# Patient Record
Sex: Female | Born: 1935 | Race: Black or African American | Hispanic: No | Marital: Married | Smoking: Never smoker
Health system: Southern US, Community
[De-identification: ages and names within clinical notes are randomized; demographics above are authoritative.]

## PROBLEM LIST (undated history)

## (undated) DIAGNOSIS — I10 Essential (primary) hypertension: Secondary | ICD-10-CM

## (undated) HISTORY — DX: Essential (primary) hypertension: I10

---

## 2003-11-27 ENCOUNTER — Ambulatory Visit (HOSPITAL_COMMUNITY): Admission: RE | Admit: 2003-11-27 | Discharge: 2003-11-27 | Payer: Self-pay | Admitting: Internal Medicine

## 2004-02-16 ENCOUNTER — Emergency Department (HOSPITAL_COMMUNITY): Admission: EM | Admit: 2004-02-16 | Discharge: 2004-02-16 | Payer: Self-pay

## 2006-03-14 ENCOUNTER — Other Ambulatory Visit: Admission: RE | Admit: 2006-03-14 | Discharge: 2006-03-14 | Payer: Self-pay | Admitting: Internal Medicine

## 2006-03-28 ENCOUNTER — Encounter: Admission: RE | Admit: 2006-03-28 | Discharge: 2006-03-28 | Payer: Self-pay | Admitting: Internal Medicine

## 2009-10-06 ENCOUNTER — Ambulatory Visit (HOSPITAL_COMMUNITY): Admission: RE | Admit: 2009-10-06 | Discharge: 2009-10-06 | Payer: Self-pay | Admitting: Internal Medicine

## 2009-10-12 ENCOUNTER — Emergency Department (HOSPITAL_COMMUNITY): Admission: EM | Admit: 2009-10-12 | Discharge: 2009-10-13 | Payer: Self-pay | Admitting: Emergency Medicine

## 2009-10-15 ENCOUNTER — Ambulatory Visit (HOSPITAL_COMMUNITY): Admission: RE | Admit: 2009-10-15 | Discharge: 2009-10-15 | Payer: Self-pay | Admitting: Internal Medicine

## 2009-10-15 ENCOUNTER — Encounter: Payer: Self-pay | Admitting: Internal Medicine

## 2010-10-17 LAB — COMPREHENSIVE METABOLIC PANEL
ALT: 14 U/L (ref 0–35)
AST: 24 U/L (ref 0–37)
Albumin: 3.5 g/dL (ref 3.5–5.2)
Alkaline Phosphatase: 67 U/L (ref 39–117)
Creatinine, Ser: 1.41 mg/dL — ABNORMAL HIGH (ref 0.4–1.2)
Potassium: 2.8 mEq/L — ABNORMAL LOW (ref 3.5–5.1)
Total Bilirubin: 0.6 mg/dL (ref 0.3–1.2)
Total Protein: 7.1 g/dL (ref 6.0–8.3)

## 2010-10-17 LAB — DIFFERENTIAL
Basophils Absolute: 0 10*3/uL (ref 0.0–0.1)
Basophils Relative: 0 % (ref 0–1)
Eosinophils Absolute: 0.1 10*3/uL (ref 0.0–0.7)
Eosinophils Relative: 1 % (ref 0–5)
Lymphocytes Relative: 8 % — ABNORMAL LOW (ref 12–46)
Lymphs Abs: 0.9 10*3/uL (ref 0.7–4.0)
Monocytes Relative: 1 % — ABNORMAL LOW (ref 3–12)
Neutro Abs: 11.4 10*3/uL — ABNORMAL HIGH (ref 1.7–7.7)

## 2010-10-17 LAB — CBC
MCV: 91.4 fL (ref 78.0–100.0)
Platelets: 169 10*3/uL (ref 150–400)
RDW: 14.1 % (ref 11.5–15.5)

## 2010-10-17 LAB — LIPASE, BLOOD: Lipase: 23 U/L (ref 11–59)

## 2011-01-04 ENCOUNTER — Ambulatory Visit (HOSPITAL_COMMUNITY)
Admission: RE | Admit: 2011-01-04 | Discharge: 2011-01-04 | Disposition: A | Discharge status: Home / Self Care | Payer: BC Managed Care – PPO | Source: Ambulatory Visit | Attending: Internal Medicine | Admitting: Internal Medicine

## 2011-01-04 ENCOUNTER — Other Ambulatory Visit: Payer: Self-pay | Admitting: Internal Medicine

## 2011-01-04 DIAGNOSIS — R05 Cough: Secondary | ICD-10-CM

## 2011-01-04 DIAGNOSIS — R059 Cough, unspecified: Secondary | ICD-10-CM | POA: Insufficient documentation

## 2011-01-04 DIAGNOSIS — I517 Cardiomegaly: Secondary | ICD-10-CM | POA: Insufficient documentation

## 2011-01-18 ENCOUNTER — Telehealth: Payer: Self-pay | Admitting: *Deleted

## 2011-01-18 NOTE — Telephone Encounter (Signed)
Spoke with someone from Dr Ophelia Charter office and sched appt with MW for 8:45 on 01/20/11. Advised her to have the pt arrive 15 min early and bring all meds in hand. She verbalized understanding and states will inform the pt.

## 2011-01-20 ENCOUNTER — Ambulatory Visit (INDEPENDENT_AMBULATORY_CARE_PROVIDER_SITE_OTHER): Payer: Medicare Other | Admitting: Internal Medicine

## 2011-01-20 ENCOUNTER — Encounter: Payer: Self-pay | Admitting: Internal Medicine

## 2011-01-20 VITALS — BP 120/70 | HR 73 | Temp 97.9°F | Ht 63.0 in | Wt 145.8 lb

## 2011-01-20 DIAGNOSIS — R05 Cough: Secondary | ICD-10-CM

## 2011-01-20 DIAGNOSIS — R053 Chronic cough: Secondary | ICD-10-CM | POA: Insufficient documentation

## 2011-01-20 MED ORDER — PREDNISONE (PAK) 10 MG PO TABS: ORAL_TABLET | ORAL | Status: AC

## 2011-01-20 NOTE — Progress Notes (Signed)
Subjective:     Patient ID: Yvonne Riddle, female   DOB: 26-Sep-1930, 77 y.o.   MRN: [id number]  HPI  63 yobf never smoker no prev respiratory/allergy  problems referred for chronic cough by Dr Chestine Spore 12/2010   01/20/2011 Initial pulmonary office indolent onset x 3 months first when lie down and evolved to 24 hours and brings up a little white mucus assoc with some sneezing.  Has not tried otc's but tried off lasartan and started lansoprazole  Taking it before bfast daily but only 15 mg, maybe helps some.  Constant sensation of too much throat mucus. No assoc sob  Pt denies any significant sore throat, dysphagia, itching, sneezing,  nasal congestion or excess/ purulent secretions,  fever, chills, sweats, unintended wt loss, pleuritic or exertional cp, hempoptysis, orthopnea pnd or leg swelling.    Also denies any obvious fluctuation of symptoms with weather or environmental changes or other aggravating or alleviating factors.     Review of Systems  Constitutional: Negative for fever, chills and unexpected weight change.  HENT: Negative for ear pain, nosebleeds, congestion, sore throat, rhinorrhea, sneezing, trouble swallowing, dental problem, voice change, postnasal drip and sinus pressure.   Eyes: Negative for visual disturbance.  Respiratory: Positive for cough. Negative for choking and shortness of breath.   Cardiovascular: Negative for chest pain and leg swelling.  Gastrointestinal: Negative for vomiting, abdominal pain and diarrhea.  Genitourinary: Negative for difficulty urinating.  Musculoskeletal: Negative for arthralgias.  Skin: Negative for rash.  Neurological: Negative for tremors, syncope and headaches.  Hematological: Does not bruise/bleed easily.       Objective:   Physical Exam amb bf nad  freq throat clearing full dentures  Wt 145 01/20/2011 HEENT:   Nl bilateral turbinates, and orophanx. Nl external ear canals without cough reflex   NECK :  without JVD/Nodes/TM/  nl carotid upstrokes bilaterally   LUNGS: no acc muscle use, clear to A and P bilaterally without cough on insp or exp maneuvers   CV:  RRR  no s3 or murmur or increase in P2, no edema   ABD:  soft and nontender with nl excursion in the supine position. No bruits or organomegaly, bowel sounds nl  MS:  warm without deformities, calf tenderness, cyanosis or clubbing  SKIN: warm and dry without lesions    NEURO:  alert, approp, no deficits     cxr 01/04/2011 Comparison: Chest x-ray of 10/06/2009  Findings: There is stable linear scarring in the lingula. No  active infiltrate or effusion is seen. Cardiomegaly is stable.  The bones are slightly osteopenic.  IMPRESSION:  Stable cardiomegaly. No active lung disease  Assessment:         Plan:

## 2011-01-20 NOTE — Patient Instructions (Signed)
GERD (REFLUX)  is an extremely common cause of respiratory symptoms, many times with no significant heartburn at all.    It can be treated with medication, but also with lifestyle changes including avoidance of late meals, excessive alcohol, smoking cessation, and avoid fatty foods, chocolate, peppermint, colas, red wine, and acidic juices such as orange juice.  NO MINT OR MENTHOL PRODUCTS SO NO COUGH DROPS  USE SUGARLESS CANDY INSTEAD (jolley ranchers or Stover's)  NO OIL BASED VITAMINS   Continue prevacid 30 mg (Take 2 x 15) 30-60 min before first meal of the day and add Pepcid 20 mg one at bedtime  Prednisone 10 mg take  4 each am x 2 days,   2 each am x 2 days,  1 each am x2days and stop   Please schedule a follow up office visit in 4 weeks, sooner if needed

## 2011-01-20 NOTE — Assessment & Plan Note (Addendum)
The most common causes of chronic cough in immunocompetent adults include the following: upper airway cough syndrome (UACS), previously referred to as postnasal drip syndrome (PNDS), which is caused by variety of rhinosinus conditions; (2) asthma; (3) GERD; (4) chronic bronchitis from cigarette smoking or other inhaled environmental irritants; (5) nonasthmatic eosinophilic bronchitis; and (6) bronchiectasis.   These conditions, singly or in combination, have accounted for up to 94% of the causes of chronic cough in prospective studies.   Other conditions have constituted no >6% of the causes in prospective studies These have included bronchogenic carcinoma, chronic interstitial pneumonia, sarcoidosis, left ventricular failure, ACEI-induced cough, and aspiration from a condition associated with pharyngeal dysfunction.   This is most c/w  Classic Upper airway cough syndrome, so named because it's frequently impossible to sort out how much is  CR/sinusitis with freq throat clearing (which can be related to primary GERD)   vs  causing  secondary (" extra esophageal")  GERD from wide swings in gastric pressure that occur with throat clearing, often  promoting self use of mint and menthol lozenges that reduce the lower esophageal sphincter tone and exacerbate the problem further in a cyclical fashion.   These are the same pts who not infrequently have failed to tolerate ace inhibitors,  dry powder inhalers or biphosphonates or report having reflux symptoms that don't respond to standard doses of PPI , and are easily confused as having aecopd or asthma flares,   Explained to pt:  Unlike when you get a prescription for eyeglasses, it's not possible to always walk out of this or any medical office with a perfect prescription that is immediately effective  based on any test that we offer here.    On the contrary, it may take several weeks for the full impact of changes recommened today - hopefully you will  respond well.  If not, then we'll adjust your medication on your next visit accordingly, knowing more then than we can possibly know now.      For now try max acid suppression with very short course of prednisone in case this is inflammatory/allergic (doubt)  and f/u in 2 weeks

## 2011-02-17 ENCOUNTER — Ambulatory Visit (INDEPENDENT_AMBULATORY_CARE_PROVIDER_SITE_OTHER): Payer: Medicare Other | Admitting: Internal Medicine

## 2011-02-17 ENCOUNTER — Encounter: Payer: Self-pay | Admitting: Internal Medicine

## 2011-02-17 VITALS — BP 110/62 | HR 77 | Temp 98.0°F | Ht 63.0 in | Wt 147.6 lb

## 2011-02-17 DIAGNOSIS — R05 Cough: Secondary | ICD-10-CM

## 2011-02-17 MED ORDER — FAMOTIDINE 20 MG PO TABS: ORAL_TABLET | ORAL | Status: DC

## 2011-02-17 MED ORDER — PREDNISONE (PAK) 10 MG PO TABS: ORAL_TABLET | ORAL | Status: AC

## 2011-02-17 MED ORDER — OMEPRAZOLE 20 MG PO CPDR: DELAYED_RELEASE_CAPSULE | ORAL | Status: DC

## 2011-02-17 NOTE — Patient Instructions (Signed)
GERD (REFLUX)  is an extremely common cause of respiratory symptoms, many times with no significant heartburn at all.    It can be treated with medication, but also with lifestyle changes including avoidance of late meals, excessive alcohol, smoking cessation, and avoid fatty foods, chocolate, peppermint, colas, red wine, and acidic juices such as orange juice.  NO MINT OR MENTHOL PRODUCTS SO NO COUGH DROPS  USE SUGARLESS CANDY INSTEAD (jolley ranchers or Stover's)  NO OIL BASED VITAMINS  Prednisone Take 4 for two days, three for two days, two for two days, then one for two days and stop   Try prilosec 20mg   Take  2 30-60 min before first meal of the day and Pepcid 20 mg one bedtime   For drainage or tickling in throat use chlortrimeton 4mg  take one at bedtime and as needed during the day  Please schedule a follow up office visit in 4 weeks, sooner if needed with all medications in hand including over the counter meds

## 2011-02-17 NOTE — Progress Notes (Signed)
Subjective:     Patient ID: Yvonne Riddle, female   DOB: 1931-07-17, 77 y.o.   MRN: [id number]  HPI  55 yobf never smoker no prev respiratory/allergy  problems referred for chronic cough by Dr Chestine Spore 12/2010   01/20/2011 Initial pulmonary office indolent onset x 3 months first when lie down and evolved to 24 hours and brings up a little white mucus assoc with some sneezing.  Has not tried otc's but tried off lasartan and started lansoprazole  Taking it before bfast daily but only 15 mg, maybe helps some.  Constant sensation of too much throat mucus. No assoc sob  GERD (REFLUX) diet   Continue prevacid 30 mg (Take 2 x 15) 30-60 min before first meal of the day and add Pepcid 20 mg one at bedtime  Prednisone 10 mg take  4 each am x 2 days,   2 each am x 2 days,  1 each am x2days and stop  02/17/2011 ov/Fletcher Ostermiller  Cc cough transiently better p prednisone, then worse again  But not taking ppi as directed nor did she bring all meds back as requested.  Cough is more dry than wet, more day than night.  No sob  Pt denies any significant sore throat, dysphagia, itching, sneezing,  nasal congestion or excess/ purulent secretions,  fever, chills, sweats, unintended wt loss, pleuritic or exertional cp, hempoptysis, orthopnea pnd or leg swelling.    Also denies any obvious fluctuation of symptoms with weather or environmental changes or other aggravating or alleviating factors.             Objective:   Physical Exam amb bf nad   freq throat clearing full dentures with hopeless helpless affect  Wt 145 01/20/2011 > 147 02/17/2011   HEENT:   Nl bilateral turbinates, and orophanx. Nl external ear canals without cough reflex   NECK :  without JVD/Nodes/TM/ nl carotid upstrokes bilaterally   LUNGS: no acc muscle use, clear to A and P bilaterally without cough on insp or exp maneuvers   CV:  RRR  no s3 or murmur or increase in P2, no edema   ABD:  soft and nontender with nl excursion in the  supine position. No bruits or organomegaly, bowel sounds nl  MS:  warm without deformities, calf tenderness, cyanosis or clubbing     cxr 01/04/2011 Comparison: Chest x-ray of 10/06/2009  Findings: There is stable linear scarring in the lingula. No  active infiltrate or effusion is seen. Cardiomegaly is stable.  The bones are slightly osteopenic.  IMPRESSION:  Stable cardiomegaly. No active lung disease  Assessment:         Plan:

## 2011-02-17 NOTE — Assessment & Plan Note (Signed)
This is most c/w  Classic Upper airway cough syndrome, so named because it's frequently impossible to sort out how much is  CR/sinusitis with freq throat clearing (which can be related to primary GERD)   vs  causing  secondary (" extra esophageal")  GERD from wide swings in gastric pressure that occur with throat clearing, often  promoting self use of mint and menthol lozenges that reduce the lower esophageal sphincter tone and exacerbate the problem further in a cyclical fashion.   These are the same pts who not infrequently have failed to tolerate ace inhibitors,  dry powder inhalers or biphosphonates or report having reflux symptoms that don't respond to standard doses of PPI , and are easily confused as having aecopd or asthma flares,  The standardized cough guidelines recently published in [facility] by [place] in 2006  are a multiple step process (up to 12!) , not a single office visit,  and are intended  to address this problem logically,  with an alogrithm dependent on response to empiric treatment at  each progressive step  to determine a specific diagnosis with  minimal addtional testing needed. Therefore if compliance is an issue or can't be accurately verified then it's very unlikely the standard evaluation and treatment will be successful here.    Furthermore, response to therapy (other than acute cough suppression, which should only be used short term with avoidance of narcotic containing cough syrups if possible), can be a gradual process for which the patient may not receive immediate benefit.  Unlike going to an eye doctor where the right rx is almost always the first one and is immediately effective, this is almost never the case in the management of chronic cough syndromes and the patient needs to commit up front to compliance with recommendations and have the patience to wait out a response for up to 6 weeks of therapy directed at the likely underlying problem(s).    For now continue  efforts at max gerd rx this time add 1st gen h1 per guidlines

## 2011-03-17 ENCOUNTER — Other Ambulatory Visit (INDEPENDENT_AMBULATORY_CARE_PROVIDER_SITE_OTHER): Payer: Medicare Other

## 2011-03-17 ENCOUNTER — Ambulatory Visit (INDEPENDENT_AMBULATORY_CARE_PROVIDER_SITE_OTHER): Payer: Medicare Other | Admitting: Internal Medicine

## 2011-03-17 ENCOUNTER — Encounter: Payer: Self-pay | Admitting: Internal Medicine

## 2011-03-17 VITALS — BP 108/64 | HR 76 | Temp 97.4°F | Ht 63.0 in | Wt 149.8 lb

## 2011-03-17 DIAGNOSIS — I10 Essential (primary) hypertension: Secondary | ICD-10-CM | POA: Insufficient documentation

## 2011-03-17 DIAGNOSIS — R05 Cough: Secondary | ICD-10-CM

## 2011-03-17 LAB — CBC WITH DIFFERENTIAL/PLATELET
Basophils Absolute: 0.1 10*3/uL (ref 0.0–0.1)
MCHC: 33.5 g/dL (ref 30.0–36.0)
Monocytes Absolute: 0.7 10*3/uL (ref 0.1–1.0)
Monocytes Relative: 8.9 % (ref 3.0–12.0)
Neutro Abs: 4.2 10*3/uL (ref 1.4–7.7)
Neutrophils Relative %: 55.8 % (ref 43.0–77.0)
Platelets: 251 10*3/uL (ref 150.0–400.0)
RBC: 4.14 Mil/uL (ref 3.87–5.11)
RDW: 14.3 % (ref 11.5–14.6)
WBC: 7.6 10*3/uL (ref 4.5–10.5)

## 2011-03-17 MED ORDER — PREDNISONE (PAK) 10 MG PO TABS: ORAL_TABLET | ORAL | Status: AC

## 2011-03-17 MED ORDER — NEBIVOLOL HCL 10 MG PO TABS: ORAL_TABLET | ORAL | Status: DC

## 2011-03-17 NOTE — Assessment & Plan Note (Signed)
Classic Upper airway cough syndrome, so named because it's frequently impossible to sort out how much is  CR/sinusitis with freq throat clearing (which can be related to primary GERD)   vs  causing  secondary (" extra esophageal")  GERD from wide swings in gastric pressure that occur with throat clearing, often  promoting self use of mint and menthol lozenges that reduce the lower esophageal sphincter tone and exacerbate the problem further in a cyclical fashion.   These are the same pts who not infrequently have failed to tolerate ace inhibitors,  dry powder inhalers or biphosphonates or report having reflux symptoms that don't respond to standard doses of PPI , and are easily confused as having aecopd or asthma flares,   The standardized cough guidelines recently published in [facility] by [place] in 2006  are a multiple step process (up to 12!) , not a single office visit,  and are intended  to address this problem logically,  with an alogrithm dependent on response to empiric treatment at  each progressive step  to determine a specific diagnosis with  minimal addtional testing needed. Therefore if compliance is an issue or can't be accurately verified then it's very unlikely the standard evaluation and treatment will be successful here.    Furthermore, response to therapy (other than acute cough suppression, which should only be used short term with avoidance of narcotic containing cough syrups if possible), can be a gradual process for which the patient may not receive immediate benefit.  Unlike going to an eye doctor where the right rx is almost always the first one and is immediately effective, this is almost never the case in the management of chronic cough syndromes and the patient needs to commit up front to compliance with recommendations and have the patience to wait out a response for up to 6 weeks of therapy directed at the likely underlying problem(s).    Try again re rx with h1 and h1hs  as per guidelines - these and last set of instructions were reviewed line by line in writing and lack of adherence in cough protocols discussed as a reason the cough never gets solved, with 94% success published when the guidelines are followed - functional cough which may be a play here, probably exlpains the other 6%

## 2011-03-17 NOTE — Patient Instructions (Signed)
GERD (REFLUX)  is an extremely common cause of respiratory symptoms, many times with no significant heartburn at all.    It can be treated with medication, but also with lifestyle changes including avoidance of late meals, excessive alcohol, smoking cessation, and avoid fatty foods, chocolate, peppermint, colas, red wine, and acidic juices such as orange juice.  NO MINT OR MENTHOL PRODUCTS SO NO COUGH DROPS  USE SUGARLESS CANDY INSTEAD (jolley ranchers or Stover's)  NO OIL BASED VITAMINS  Prednisone Take 4 for two days, three for two days, two for two days, then one for two days and stop   Continue  prilosec 20mg   Take  2 30-60 min before first meal of the day and Pepcid 20 mg one bedtime   For drainage or tickling in throat use chlortrimeton 4mg  take one at bedtime and as needed during the day  Please see patient coordinator before you leave today  to schedule sinus ct scan  Stop amlodipine and losartan and take Bystolic 10 mg one daily in their place, if blood pressure too high take bystolic twice daily  Please schedule a follow up office visit in 2 weeks, sooner if needed with all medications in hand including over the counter meds

## 2011-03-17 NOTE — Assessment & Plan Note (Signed)
Strongly prefer in this setting: Bystolic, the most beta -1  selective Beta blocker available in sample form, with bisoprolol the most selective generic choice  on the market.   CCB can cause reflux, and generic cozaar anecdotally assoc with cough

## 2011-03-17 NOTE — Progress Notes (Signed)
Quick Note:  Spoke with pt and notified of results per Dr. Wert. Pt verbalized understanding and denied any questions.  ______ 

## 2011-03-17 NOTE — Progress Notes (Signed)
Subjective:     Patient ID: Yvonne Riddle, female   DOB: 11/04/1930, 77 y.o.   MRN: [id number]  HPI  89 yobf never smoker no prev respiratory/allergy  problems referred for chronic cough by Dr Chestine Spore 12/2010   01/20/2011 Initial pulmonary office indolent onset x 3 months first when lie down and evolved to 24 hours and brings up a little white mucus assoc with some sneezing.  Has not tried otc's but tried off lasartan and started lansoprazole  Taking it before bfast daily but only 15 mg, maybe helps some.  Constant sensation of too much throat mucus. No assoc sob rec GERD (REFLUX) diet  Continue prevacid 30 mg (Take 2 x 15) 30-60 min before first meal of the day and add Pepcid 20 mg one at bedtime Prednisone 10 mg take  4 each am x 2 days,   2 each am x 2 days,  1 each am x2days and stop  02/17/2011 ov/Yvonne Riddle  Cc cough transiently better p prednisone, then worse again  But not taking ppi as directed nor did she bring all meds back as requested.  Cough is more dry than wet, more day than night.  No sob rec reviewed in writing at ov  GERD diet reviewed Prednisone Take 4 for two days, three for two days, two for two days, then one for two days and stop  Try prilosec 20mg   Take  2 30-60 min before first meal of the day and Pepcid 20 mg one bedtime  For drainage or tickling in throat use chlortrimeton 4mg  take one at bedtime and as needed during the day  Please schedule a follow up office visit in 4 weeks, sooner if needed with all medications in hand including over the counter meds    03/17/2011 f/u ov/Yvonne Riddle cc no better with prednisone at all,  More productive with white foam, worse head pillow. Note all 3 elements of hx have changed from last ov yet she says "cough is no different".  No sob.  Did not follow instructions re H1 use or pepcid 20 mg at hs "I didn't know you wanted me to to that"    Pt denies any significant sore throat, dysphagia, itching, sneezing,  nasal congestion or excess/  purulent secretions,  fever, chills, sweats, unintended wt loss, pleuritic or exertional cp, hempoptysis, orthopnea pnd or leg swelling.    Also denies any obvious fluctuation of symptoms with weather or environmental changes or other aggravating or alleviating factors.             Objective:   Physical Exam amb bf nad   freq throat clearing full dentures with hopeless helpless affect  Wt 145 01/20/2011 > 147 02/17/2011  > 03/17/2011  149  HEENT:   Nl bilateral turbinates, and orophanx. Nl external ear canals without cough reflex   NECK :  without JVD/Nodes/TM/ nl carotid upstrokes bilaterally   LUNGS: no acc muscle use, clear to A and P bilaterally without cough on insp or exp maneuvers   CV:  RRR  no s3 or murmur or increase in P2, no edema   ABD:  soft and nontender with nl excursion in the supine position. No bruits or organomegaly, bowel sounds nl  MS:  warm without deformities, calf tenderness, cyanosis or clubbing     cxr 01/04/2011 Comparison: Chest x-ray of 10/06/2009  Findings: There is stable linear scarring in the lingula. No  active infiltrate or effusion is seen. Cardiomegaly is stable.  The bones are  slightly osteopenic.  IMPRESSION:  Stable cardiomegaly. No active lung disease  Assessment:         Plan:

## 2011-03-18 LAB — ALLERGY PROFILE REGION II-DC, DE, MD, ~~LOC~~, VA
Alternaria Alternata: <0.1 kU/L (ref <0.35)
Cockroach: <0.1 kU/L (ref <0.35)
Common Ragweed: <0.1 kU/L (ref <0.35)
D. farinae: <0.1 kU/L (ref <0.35)
IgE (Immunoglobulin E), Serum: 3.9 IU/mL (ref 0.0–180.0)
Lamb's Quarters: <0.1 kU/L (ref <0.35)
Pecan/Hickory Tree IgE: <0.1 kU/L (ref <0.35)

## 2011-03-19 ENCOUNTER — Encounter: Payer: Self-pay | Admitting: Internal Medicine

## 2011-03-22 ENCOUNTER — Ambulatory Visit (INDEPENDENT_AMBULATORY_CARE_PROVIDER_SITE_OTHER)
Admission: RE | Admit: 2011-03-22 | Discharge: 2011-03-22 | Disposition: A | Discharge status: Home / Self Care | Payer: Medicare Other | Source: Ambulatory Visit | Attending: Internal Medicine | Admitting: Internal Medicine

## 2011-03-22 ENCOUNTER — Encounter: Payer: Self-pay | Admitting: Internal Medicine

## 2011-03-22 DIAGNOSIS — R05 Cough: Secondary | ICD-10-CM

## 2011-04-05 ENCOUNTER — Ambulatory Visit (INDEPENDENT_AMBULATORY_CARE_PROVIDER_SITE_OTHER): Payer: Medicare Other | Admitting: Internal Medicine

## 2011-04-05 ENCOUNTER — Encounter: Payer: Self-pay | Admitting: Internal Medicine

## 2011-04-05 DIAGNOSIS — R059 Cough, unspecified: Secondary | ICD-10-CM

## 2011-04-05 DIAGNOSIS — I10 Essential (primary) hypertension: Secondary | ICD-10-CM

## 2011-04-05 DIAGNOSIS — R05 Cough: Secondary | ICD-10-CM

## 2011-04-05 NOTE — Assessment & Plan Note (Signed)
Adequate control on present rx, reviewed  

## 2011-04-05 NOTE — Progress Notes (Signed)
Subjective:     Patient ID: Yvonne Riddle, female   DOB: [birth year], 77 y.o.   MRN: [id number]  HPI  61 yobf never smoker no prev respiratory/allergy  problems referred for chronic cough by Dr Chestine Spore 12/2010   01/20/2011 Initial pulmonary office indolent onset x 3 months first when lie down and evolved to 24 hours and brings up a little white mucus assoc with some sneezing.  Has not tried otc's but tried off lasartan and started lansoprazole  Taking it before bfast daily but only 15 mg, maybe helps some.  Constant sensation of too much throat mucus. No assoc sob rec GERD (REFLUX) diet  Continue prevacid 30 mg (Take 2 x 15) 30-60 min before first meal of the day and add Pepcid 20 mg one at bedtime Prednisone 10 mg take  4 each am x 2 days,   2 each am x 2 days,  1 each am x2days and stop  02/17/2011 ov/Jilian West  Cc cough transiently better p prednisone, then worse again  But not taking ppi as directed nor did she bring all meds back as requested.  Cough is more dry than wet, more day than night.  No sob rec reviewed in writing at ov  GERD diet reviewed Prednisone Take 4 for two days, three for two days, two for two days, then one for two days and stop  Try prilosec 20mg   Take  2 30-60 min before first meal of the day and Pepcid 20 mg one bedtime  For drainage or tickling in throat use chlortrimeton 4mg  take one at bedtime and as needed during the day  Please schedule a follow up office visit in 4 weeks, sooner if needed with all medications in hand including over the counter meds    03/17/2011 f/u ov/Yvonne Riddle cc no better with prednisone at all,  More productive with white foam, worse head pillow. Note all 3 elements of hx have changed from last ov yet she says "cough is no different".  No sob.  Did not follow instructions re H1 use or pepcid 20 mg at hs "I didn't know you wanted me to to that" GERD diet Prednisone Take 4 for two days, three for two days, two for two days, then one for two days and  stop  Continue  prilosec 20mg   Take  2 30-60 min before first meal of the day and Pepcid 20 mg one bedtime  For drainage or tickling in throat use chlortrimeton 4mg  take one at bedtime and as needed during the day Please see patient coordinator before you leave today  to schedule sinus ct scan Stop amlodipine and losartan and take Bystolic 10 mg one daily in their place, if blood pressure too high take bystolic twice daily  Please schedule a follow up office visit in 2 weeks, sooner if needed with all medications in hand including over the counter meds   04/05/2011 f/u ov/Yvonne Riddle cc cough much better. Did not get otc H1 as rec, o/w appears compliant with above rx  Pt denies any significant sore throat, dysphagia, itching, sneezing,  nasal congestion or excess/ purulent secretions,  fever, chills, sweats, unintended wt loss, pleuritic or exertional cp, hempoptysis, orthopnea pnd or leg swelling.    Also denies any obvious fluctuation of symptoms with weather or environmental changes or other aggravating or alleviating factors.     DDX of  difficult airways managment all start with A and  include Adherence, Ace Inhibitors, Acid Reflux, Active Sinus Disease, Alpha 1  Antitripsin deficiency, Anxiety masquerading as Airways dz,  ABPA,  allergy(esp in young), Aspiration (esp in elderly), Adverse effects of DPI,  Active smokers, plus two Bs  = Bronchiectasis and Beta blocker use..and one C= CHF           Objective:   Physical Exam amb bf nad   freq throat clearing full dentures with much less  hopeless helpless affect  Wt 145 01/20/2011 > 147 02/17/2011  > 03/17/2011  149 > 04/05/2011  150  HEENT:   Nl bilateral turbinates, and orophanx. Nl external ear canals without cough reflex   NECK :  without JVD/Nodes/TM/ nl carotid upstrokes bilaterally   LUNGS: no acc muscle use, clear to A and P bilaterally without cough on insp or exp maneuvers   CV:  RRR  no s3 or murmur or increase in P2, no  edema   ABD:  soft and nontender with nl excursion in the supine position. No bruits or organomegaly, bowel sounds nl  MS:  warm without deformities, calf tenderness, cyanosis or clubbing     cxr 01/04/2011 Comparison: Chest x-ray of 10/06/2009  Findings: There is stable linear scarring in the lingula. No  active infiltrate or effusion is seen. Cardiomegaly is stable.  The bones are slightly osteopenic.  IMPRESSION:  Stable cardiomegaly. No active lung disease  Assessment:         Plan:

## 2011-04-05 NOTE — Patient Instructions (Signed)
If throat drainage bothers you try the chlortrimeton 4mg  as previously recommended  Please schedule a follow up office visit in 4 weeks, sooner if needed

## 2011-05-05 ENCOUNTER — Ambulatory Visit: Payer: Medicare Other | Admitting: Internal Medicine

## 2011-05-10 ENCOUNTER — Ambulatory Visit (INDEPENDENT_AMBULATORY_CARE_PROVIDER_SITE_OTHER): Payer: Medicare Other | Admitting: Internal Medicine

## 2011-05-10 ENCOUNTER — Encounter: Payer: Self-pay | Admitting: Internal Medicine

## 2011-05-10 DIAGNOSIS — R05 Cough: Secondary | ICD-10-CM

## 2011-05-10 DIAGNOSIS — I10 Essential (primary) hypertension: Secondary | ICD-10-CM

## 2011-05-10 MED ORDER — BENZONATATE 200 MG PO CAPS: ORAL_CAPSULE | ORAL | Status: DC

## 2011-05-10 NOTE — Progress Notes (Signed)
Subjective:     Patient ID: Yvonne Riddle, female   DOB: 1931-04-29, 77 y.o.   MRN: [id number]  HPI  73 yobf never smoker no prev respiratory/allergy  problems referred for chronic cough by Dr Chestine Spore 12/2010   01/20/2011 Initial pulmonary office indolent onset x 3 months first when lie down and evolved to 24 hours and brings up a little white mucus assoc with some sneezing.  Has not tried otc's but tried off lasartan and started lansoprazole  Taking it before bfast daily but only 15 mg, maybe helps some.  Constant sensation of too much throat mucus. No assoc sob rec GERD  diet  Continue prevacid 30 mg (Take 2 x 15) 30-60 min before first meal of the day and add Pepcid 20 mg one at bedtime Prednisone 10 mg take  4 each am x 2 days,   2 each am x 2 days,  1 each am x2days and stop  02/17/2011 ov/Yvonne Riddle  Cc cough transiently better p prednisone, then worse again  But not taking ppi as directed nor did she bring all meds back as requested.  Cough is more dry than wet, more day than night.  No sob rec reviewed in writing at ov  GERD diet reviewed Prednisone Take 4 for two days, three for two days, two for two days, then one for two days and stop  Try prilosec 20mg   Take  2 30-60 min before first meal of the day and Pepcid 20 mg one bedtime  For drainage or tickling in throat use chlortrimeton 4mg  take one at bedtime and as needed during the day Please schedule a follow up office visit in 4 weeks, sooner if needed with all medications in hand including over the counter meds    03/17/2011 f/u ov/Yvonne Riddle cc no better with prednisone at all,  More productive with white foam, worse head pillow. Note all 3 elements of hx have changed from last ov yet she says "cough is no different".  No sob.  Did not follow instructions re H1 use or pepcid 20 mg at hs "I didn't know you wanted me to to that" GERD diet Prednisone Take 4 for two days, three for two days, two for two days, then one for two days and stop    Continue  prilosec 20mg   Take  2 30-60 min before first meal of the day and Pepcid 20 mg one bedtime  For drainage or tickling in throat use chlortrimeton 4mg  take one at bedtime and as needed during the day Please see patient coordinator before you leave today  to schedule sinus ct scan > mild thickening only Stop amlodipine and losartan and take Bystolic 10 mg one daily in their place, if blood pressure too high take bystolic twice daily  Please schedule a follow up office visit in 2 weeks, sooner if needed with all medications in hand including over the counter meds   04/05/2011 f/u ov/Yvonne Riddle cc cough much better. Did not get otc H1 as rec, o/w appears compliant with above rx rec If throat drainage bothers you try the chlortrimeton 4mg  as previously recommended > could not find it otc    05/10/2011 f/u ov/Yvonne Riddle cc dry cough better, still not on H1.   Some worse singing and  at hs but rarely disturbs sleep. Back on amlodipine and cozaar "no change on or off bystolic" - did not bring meds as requested    Pt denies any significant sore throat, dysphagia, itching, sneezing,  nasal  congestion or excess/ purulent secretions,  fever, chills, sweats, unintended wt loss, pleuritic or exertional cp, hempoptysis, orthopnea pnd or leg swelling.    Also denies any obvious fluctuation of symptoms with weather or environmental changes or other aggravating or alleviating factors.                Objective:   Physical Exam  amb bf nad   freq throat clearing full dentures with much less  hopeless helpless affect  Wt 145 01/20/2011 > 147 02/17/2011  > 03/17/2011  149 > 04/05/2011  150 > 05/10/2011  145   HEENT:   Nl bilateral turbinates, and orophanx. Nl external ear canals without cough reflex   NECK :  without JVD/Nodes/TM/ nl carotid upstrokes bilaterally   LUNGS: no acc muscle use, clear to A and P bilaterally without cough on insp or exp maneuvers   CV:  RRR  no s3 or murmur or increase  in P2, no edema   ABD:  soft and nontender with nl excursion in the supine position. No bruits or organomegaly, bowel sounds nl  MS:  warm without deformities, calf tenderness, cyanosis or clubbing     cxr 01/04/2011 Comparison: Chest x-ray of 10/06/2009  Findings: There is stable linear scarring in the lingula. No  active infiltrate or effusion is seen. Cardiomegaly is stable.  The bones are slightly osteopenic.  IMPRESSION:  Stable cardiomegaly. No active lung disease  Assessment:         Plan:

## 2011-05-10 NOTE — Assessment & Plan Note (Signed)
Adequate control on present rx, reviewed > doubt ccb or cozar causing cough though both reported anecdotally. Ok to continue on these for now but if still coughing rec try change to bystolic x 6 weeks minimum then regroup

## 2011-05-10 NOTE — Assessment & Plan Note (Addendum)
-   Allergy profile 03/17/2011 >>>  No eos   IgE 3.9, neg aeroallergens identifiedd    - Sinus CT 03/22/2011 > Minimal paranasal sinus mucosal thickening  The most common causes of chronic cough in immunocompetent adults include the following: upper airway cough syndrome (UACS), previously referred to as postnasal drip syndrome (PNDS), which is caused by variety of rhinosinus conditions; (2) asthma; (3) GERD; (4) chronic bronchitis from cigarette smoking or other inhaled environmental irritants; (5) nonasthmatic eosinophilic bronchitis; and (6) bronchiectasis.   These conditions, singly or in combination, have accounted for up to 94% of the causes of chronic cough in prospective studies.   Other conditions have constituted no >6% of the causes in prospective studies These have included bronchogenic carcinoma, chronic interstitial pneumonia, sarcoidosis, left ventricular failure, ACEI-induced cough, and aspiration from a condition associated with pharyngeal dysfunction.   This is most c/w  Classic Upper airway cough syndrome, so named because it's frequently impossible to sort out how much is  CR/sinusitis with freq throat clearing (which can be related to primary GERD)   vs  causing  secondary (" extra esophageal")  GERD from wide swings in gastric pressure that occur with throat clearing, often  promoting self use of mint and menthol lozenges that reduce the lower esophageal sphincter tone and exacerbate the problem further in a cyclical fashion.   These are the same pts who not infrequently have failed to tolerate ace inhibitors,  dry powder inhalers or biphosphonates or report having reflux symptoms that don't respond to standard doses of PPI , and are easily confused as having aecopd or asthma flares,   rec max gerd rx,  H1 at hs per guidelines and prn tessilon, f/u prn.

## 2011-05-10 NOTE — Patient Instructions (Signed)
This cough is typical of a throat problem which is made worse by coughing  Tessilon pearls 200mg  every 6 hours may be effective for daytime cough   Take Chlortrimeton 4mg  one at bedtime (ask the pharmacist for this)   If you are satisfied with your treatment plan let your doctor know and he/she can either refill your medications or you can return here when your prescription runs out.     If in any way you are not 100% satisfied,   Return with all medications.  If 100% better, tell your friends!

## 2014-08-29 ENCOUNTER — Emergency Department (INDEPENDENT_AMBULATORY_CARE_PROVIDER_SITE_OTHER): Payer: Medicare PPO

## 2014-08-29 ENCOUNTER — Encounter (HOSPITAL_COMMUNITY): Payer: Self-pay | Admitting: Emergency Medicine

## 2014-08-29 ENCOUNTER — Emergency Department (INDEPENDENT_AMBULATORY_CARE_PROVIDER_SITE_OTHER)
Admission: EM | Admit: 2014-08-29 | Discharge: 2014-08-29 | Disposition: A | Discharge status: Home / Self Care | Payer: Medicare PPO | Source: Home / Self Care | Attending: Family Medicine | Admitting: Family Medicine

## 2014-08-29 DIAGNOSIS — M545 Low back pain, unspecified: Secondary | ICD-10-CM

## 2014-08-29 DIAGNOSIS — R52 Pain, unspecified: Secondary | ICD-10-CM | POA: Diagnosis not present

## 2014-08-29 LAB — POCT URINALYSIS DIP (DEVICE)
Bilirubin Urine: NEGATIVE
GLUCOSE, UA: NEGATIVE mg/dL
KETONES UR: NEGATIVE mg/dL
Nitrite: NEGATIVE
Protein, ur: NEGATIVE mg/dL
Specific Gravity, Urine: 1.02 (ref 1.005–1.030)
UROBILINOGEN UA: 0.2 mg/dL (ref 0.0–1.0)
pH: 5.5 (ref 5.0–8.0)

## 2014-08-29 LAB — POCT I-STAT, CHEM 8
BUN: 16 mg/dL (ref 6–23)
CALCIUM ION: 1.17 mmol/L (ref 1.13–1.30)
CHLORIDE: 108 mmol/L (ref 96–112)
CREATININE: 0.9 mg/dL (ref 0.50–1.10)
GLUCOSE: 121 mg/dL — AB (ref 70–99)
HCT: 41 % (ref 36.0–46.0)
HEMOGLOBIN: 13.9 g/dL (ref 12.0–15.0)
POTASSIUM: 3.9 mmol/L (ref 3.5–5.1)
Sodium: 143 mmol/L (ref 135–145)
TCO2: 19 mmol/L (ref 0–100)

## 2014-08-29 MED ORDER — HYDROCODONE-ACETAMINOPHEN 5-325 MG PO TABS
1.0000 | ORAL_TABLET | Freq: Once | ORAL | Status: AC
  Administered 2014-08-29: 1 via ORAL

## 2014-08-29 MED ORDER — HYDROCODONE-ACETAMINOPHEN 5-325 MG PO TABS: 1.0000 | ORAL_TABLET | Freq: Four times a day (QID) | ORAL | Status: DC | PRN

## 2014-08-29 MED ORDER — HYDROCODONE-ACETAMINOPHEN 5-325 MG PO TABS
ORAL_TABLET | ORAL | Status: AC
  Filled 2014-08-29: qty 1

## 2014-08-29 NOTE — Discharge Instructions (Signed)
Back Pain, Adult °Low back pain is very common. About 1 in 5 people have back pain. The cause of low back pain is rarely dangerous. The pain often gets better over time. About half of people with a sudden onset of back pain feel better in just 2 weeks. About 8 in 10 people feel better by 6 weeks.  °CAUSES °Some common causes of back pain include: °· Strain of the muscles or ligaments supporting the spine. °· Wear and tear (degeneration) of the spinal discs. °· Arthritis. °· Direct injury to the back. °DIAGNOSIS °Most of the time, the direct cause of low back pain is not known. However, back pain can be treated effectively even when the exact cause of the pain is unknown. Answering your caregiver's questions about your overall health and symptoms is one of the most accurate ways to make sure the cause of your pain is not dangerous. If your caregiver needs more information, he or she may order lab work or imaging tests (X-rays or MRIs). However, even if imaging tests show changes in your back, this usually does not require surgery. °HOME CARE INSTRUCTIONS °For many people, back pain returns. Since low back pain is rarely dangerous, it is often a condition that people can learn to manage on their own.  °· Remain active. It is stressful on the back to sit or stand in one place. Do not sit, drive, or stand in one place for more than 30 minutes at a time. Take short walks on level surfaces as soon as pain allows. Try to increase the length of time you walk each day. °· Do not stay in bed. Resting more than 1 or 2 days can delay your recovery. °· Do not avoid exercise or work. Your body is made to move. It is not dangerous to be active, even though your back may hurt. Your back will likely heal faster if you return to being active before your pain is gone. °· Pay attention to your body when you  bend and lift. Many people have less discomfort when lifting if they bend their knees, keep the load close to their bodies, and  avoid twisting. Often, the most comfortable positions are those that put less stress on your recovering back. °· Find a comfortable position to sleep. Use a firm mattress and lie on your side with your knees slightly bent. If you lie on your back, put a pillow under your knees. °· Only take over-the-counter or prescription medicines as directed by your caregiver. Over-the-counter medicines to reduce pain and inflammation are often the most helpful. Your caregiver may prescribe muscle relaxant drugs. These medicines help dull your pain so you can more quickly return to your normal activities and healthy exercise. °· Put ice on the injured area. °· Put ice in a plastic bag. °· Place a towel between your skin and the bag. °· Leave the ice on for 15-20 minutes, 03-04 times a day for the first 2 to 3 days. After that, ice and heat may be alternated to reduce pain and spasms. °· Ask your caregiver about trying back exercises and gentle massage. This may be of some benefit. °· Avoid feeling anxious or stressed. Stress increases muscle tension and can worsen back pain. It is important to recognize when you are anxious or stressed and learn ways to manage it. Exercise is a great option. °SEEK MEDICAL CARE IF: °· You have pain that is not relieved with rest or medicine. °· You have pain that does not improve in 1 week. °· You have new symptoms. °· You are generally not feeling well. °SEEK   IMMEDIATE MEDICAL CARE IF:  °· You have pain that radiates from your back into your legs. °· You develop new bowel or bladder control problems. °· You have unusual weakness or numbness in your arms or legs. °· You develop nausea or vomiting. °· You develop abdominal pain. °· You feel faint. °Document Released: 07/11/2005 Document Revised: 01/10/2012 Document Reviewed: 11/12/2013 °[employer]® Patient Information ©2015 [employer]. This information is not intended to replace advice given to you by your health care provider. Make sure you  discuss any questions you have with your health care provider. ° °Back Injury Prevention °Back injuries can be extremely painful and difficult to heal. After having one back injury, you are much more likely to experience another later on. It is important to learn how to avoid injuring or re-injuring your back. The following tips can help you to prevent a back injury. °PHYSICAL FITNESS °· Exercise regularly and try to develop good tone in your abdominal muscles. Your abdominal muscles provide a lot of the support needed by your back. °· Do aerobic exercises (walking, jogging, biking, swimming) regularly. °· Do exercises that increase balance and strength (tai chi, yoga) regularly. This can decrease your risk of falling and injuring your back. °· Stretch before and after exercising. °· Maintain a healthy weight. The more you weigh, the more stress is placed on your back. For every pound of weight, 10 times that amount of pressure is placed on the back. °DIET °· Talk to your caregiver about how much calcium and vitamin D you need per day. These nutrients help to prevent weakening of the bones (osteoporosis). Osteoporosis can cause broken (fractured) bones that lead to back pain. °· Include good sources of calcium in your diet, such as dairy products, green, leafy vegetables, and products with calcium added (fortified). °· Include good sources of vitamin D in your diet, such as milk and foods that are fortified with vitamin D. °· Consider taking a nutritional supplement or a multivitamin if needed. °· Stop smoking if you smoke. °POSTURE °· Sit and stand up straight. Avoid leaning forward when you sit or hunching over when you stand. °· Choose chairs with good low back (lumbar) support. °· If you work at a desk, sit close to your work so you do not need to lean over. Keep your chin tucked in. Keep your neck drawn back and elbows bent at a right angle. Your arms should look like the letter "L." °· Sit high and close to  the steering wheel when you drive. Add a lumbar support to your car seat if needed. °· Avoid sitting or standing in one position for too long. Take breaks to get up, stretch, and walk around at least once every hour. Take breaks if you are driving for long periods of time. °· Sleep on your side with your knees slightly bent, or sleep on your back with a pillow under your knees. Do not sleep on your stomach. °LIFTING, TWISTING, AND REACHING °· Avoid heavy lifting, especially repetitive lifting. If you must do heavy lifting: °¨ Stretch before lifting. °¨ Work slowly. °¨ Rest between lifts. °¨ Use carts and dollies to move objects when possible. °¨ Make several small trips instead of carrying 1 heavy load. °¨ Ask for help when you need it. °¨ Ask for help when moving big, awkward objects. °· Follow these steps when lifting: °¨ Stand with your feet shoulder-width apart. °¨ Get as close to the object as you can. Do not try to   pick up heavy objects that are far from your body.  Use handles or lifting straps if they are available.  Bend at your knees. Squat down, but keep your heels off the floor.  Keep your shoulders pulled back, your chin tucked in, and your back straight.  Lift the object slowly, tightening the muscles in your legs, abdomen, and buttocks. Keep the object as close to the center of your body as possible.  When you put a load down, use these same guidelines in reverse.  Do not:  Lift the object above your waist.  Twist at the waist while lifting or carrying a load. Move your feet if you need to turn, not your waist.  Bend over without bending at your knees.  Avoid reaching over your head, across a table, or for an object on a high surface. OTHER TIPS  Avoid wet floors and keep sidewalks clear of ice to prevent falls.  Do not sleep on a mattress that is too soft or too hard.  Keep items that are used frequently within easy reach.  Put heavier objects on shelves at waist level  and lighter objects on lower or higher shelves.  Find ways to decrease your stress, such as exercise, massage, or relaxation techniques. Stress can build up in your muscles. Tense muscles are more vulnerable to injury.  Seek treatment for depression or anxiety if needed. These conditions can increase your risk of developing back pain. SEEK MEDICAL CARE IF:  You injure your back.  You have questions about diet, exercise, or other ways to prevent back injuries. MAKE SURE YOU:  Understand these instructions.  Will watch your condition.  Will get help right away if you are not doing well or get worse. Document Released: 08/18/2004 Document Revised: 10/03/2011 Document Reviewed: 08/22/2011 [facility] Patient Information 2015 [place], [place]. This information is not intended to replace advice given to you by your health care provider. Make sure you discuss any questions you have with your health care provider.

## 2014-08-29 NOTE — ED Provider Notes (Signed)
CSN: [id number]     Arrival date & time 08/29/14  1011 History   None    Chief Complaint  Patient presents with  . Back Pain   (Consider location/radiation/quality/duration/timing/severity/associated sxs/prior Treatment) HPI      81 year old female presents for evaluation of right-sided lower back and leg pain. Last night she was repositioning herself in her bed when she had acute onset of pain in her right lower back. The pain was intermittent and last night but has become constant this morning. It is worse with ambulation and worse with any movements. She denies any extremity numbness or weakness. No hematuria or dysuria. No fever, chills, NVD. She does not smoke. No recent hospitalizations or recent travel  Past Medical History  Diagnosis Date  . Hypertension    History reviewed. No pertinent past surgical history. No family history on file. History  Substance Use Topics  . Smoking status: Never Smoker   . Smokeless tobacco: Never Used  . Alcohol Use: No   OB History    No data available     Review of Systems  Musculoskeletal: Positive for back pain.  All other systems reviewed and are negative.   Allergies  Review of patient's allergies indicates no known allergies.  Home Medications   Prior to Admission medications   Medication Sig Start Date End Date Taking? Authorizing Provider  amLODipine (NORVASC) 10 MG tablet Take 10 mg by mouth daily.     Yes Historical Provider, MD  aspirin 81 MG EC tablet Take 81 mg by mouth daily.     Yes Historical Provider, MD  losartan-hydrochlorothiazide (HYZAAR) 100-12.5 MG per tablet Take 1 tablet by mouth daily.     Yes Historical Provider, MD  potassium chloride (K-DUR) 10 MEQ tablet Take 10 mEq by mouth daily.   Yes Historical Provider, MD  benzonatate (TESSALON) 200 MG capsule One every 6 hours as needed 05/10/11   Nyoka CowdenMichael B Wert, MD  famotidine (PEPCID) 20 MG tablet One at bedtime 02/17/11 02/17/12  Nyoka CowdenMichael B Wert, MD   HYDROcodone-acetaminophen (NORCO) 5-325 MG per tablet Take 1 tablet by mouth every 6 (six) hours as needed for moderate pain. 08/29/14   Graylon GoodZachary H Ikeisha Blumberg, PA-C  nebivolol (BYSTOLIC) 10 MG tablet One daily 03/17/11   Nyoka CowdenMichael B Wert, MD  omeprazole (PRILOSEC) 20 MG capsule 2 daily before first meal daily 02/17/11   Nyoka CowdenMichael B Wert, MD   BP 128/74 mmHg  Pulse 77  Temp(Src) 98.2 F (36.8 C) (Oral)  Resp 18  SpO2 95% Physical Exam  Constitutional: She is oriented to person, place, and time. Vital signs are normal. She appears well-developed and well-nourished. She appears distressed.  HENT:  Head: Normocephalic and atraumatic.  Cardiovascular: Intact distal pulses.   Pulmonary/Chest: Effort normal. No respiratory distress.  Abdominal: There is no CVA tenderness.  Musculoskeletal:       Lumbar back: She exhibits decreased range of motion, tenderness (tenderness of the right paraspinous musculature), deformity (nontender firm midline deformity at approximately L1) and pain.  Neurological: She is alert and oriented to person, place, and time. She has normal strength. No sensory deficit. Coordination normal.  Skin: Skin is warm and dry. No rash noted. She is not diaphoretic.  Psychiatric: She has a normal mood and affect. Judgment normal.  Nursing note and vitals reviewed.   ED Course  Procedures (including critical care time) Labs Review Labs Reviewed  POCT URINALYSIS DIP (DEVICE) - Abnormal; Notable for the following:    Hgb urine dipstick  MODERATE (*)    Leukocytes, UA SMALL (*)    All other components within normal limits  POCT I-STAT, CHEM 8 - Abnormal; Notable for the following:    Glucose, Bld 121 (*)    All other components within normal limits    Imaging Review Dg Lumbar Spine Complete  08/29/2014   CLINICAL DATA:  Low back pain.  No known injury.  EXAM: LUMBAR SPINE - COMPLETE 4+ VIEW  COMPARISON:  10/06/2009.  FINDINGS: Degenerative disc disease at L5-S1 with disc space narrowing  and spurring. Endplate sclerosis. Degenerative facet disease at this level. Remainder of the disc spaces are maintained. Normal alignment. No fracture.  Aortic calcifications without evidence of aneurysm. Scattered round calcifications in the pelvis likely phleboliths, stable since prior study.  IMPRESSION: No acute bony abnormality. Degenerative disc and facet disease at L5-S1.   Electronically Signed   By: Charlett Nose M.D.   On: 08/29/2014 12:04   Dg Hip Unilat With Pelvis 2-3 Views Right  08/29/2014   CLINICAL DATA:  No known injury. Pain. Patient cares for her husband who is paraplegic.  EXAM: RIGHT HIP (WITH PELVIS) 2-3 VIEWS  COMPARISON:  None.  FINDINGS: No acute fracture or dislocation. Degenerative changes are seen in the lower spine and SI joints.  IMPRESSION: No evidence for acute  abnormality.   Electronically Signed   By: Rosalie Gums M.D.   On: 08/29/2014 12:00     MDM   1. Right-sided low back pain without sciatica   2. Pain   3. Pain    X-rays negative. Treat symptomatically with Norco. Advised rest for a few days, no heavy lifting. Follow-up with primary care when necessary  Meds ordered this encounter  Medications  . potassium chloride (K-DUR) 10 MEQ tablet    Sig: Take 10 mEq by mouth daily.  Marland Kitchen HYDROcodone-acetaminophen (NORCO) 5-325 MG per tablet    Sig: Take 1 tablet by mouth every 6 (six) hours as needed for moderate pain.    Dispense:  30 tablet    Refill:  0  . HYDROcodone-acetaminophen (NORCO/VICODIN) 5-325 MG per tablet 1 tablet    Sig:        Graylon Good, PA-C 08/29/14 1243

## 2014-08-29 NOTE — ED Notes (Signed)
C/o right side lower back pain onset last night; felt pain when she ws turning/repositioning herself Also, constantly lifting quadriplegic husband who weights 200++ lbs Pain radiates down to right leg; increases w/activity Denies inj/trauma, urinary sx Brought back in wheelchair Alert, no signs of acute distress.

## 2015-02-18 DIAGNOSIS — K209 Esophagitis, unspecified: Secondary | ICD-10-CM | POA: Diagnosis not present

## 2015-02-18 DIAGNOSIS — J40 Bronchitis, not specified as acute or chronic: Secondary | ICD-10-CM | POA: Diagnosis not present

## 2015-02-18 DIAGNOSIS — I10 Essential (primary) hypertension: Secondary | ICD-10-CM | POA: Diagnosis not present

## 2015-03-03 ENCOUNTER — Other Ambulatory Visit: Payer: Self-pay | Admitting: Internal Medicine

## 2015-03-03 ENCOUNTER — Encounter (INDEPENDENT_AMBULATORY_CARE_PROVIDER_SITE_OTHER): Payer: Self-pay

## 2015-03-03 ENCOUNTER — Ambulatory Visit (HOSPITAL_COMMUNITY)
Admission: RE | Admit: 2015-03-03 | Discharge: 2015-03-03 | Disposition: A | Discharge status: Home / Self Care | Payer: Medicare PPO | Source: Ambulatory Visit | Attending: Internal Medicine | Admitting: Internal Medicine

## 2015-03-03 DIAGNOSIS — K209 Esophagitis, unspecified: Secondary | ICD-10-CM | POA: Diagnosis not present

## 2015-03-03 DIAGNOSIS — R05 Cough: Secondary | ICD-10-CM | POA: Diagnosis not present

## 2015-03-03 DIAGNOSIS — R059 Cough, unspecified: Secondary | ICD-10-CM

## 2015-03-03 DIAGNOSIS — I10 Essential (primary) hypertension: Secondary | ICD-10-CM | POA: Diagnosis not present

## 2015-03-03 DIAGNOSIS — J984 Other disorders of lung: Secondary | ICD-10-CM | POA: Insufficient documentation

## 2015-04-10 DIAGNOSIS — H52223 Regular astigmatism, bilateral: Secondary | ICD-10-CM | POA: Diagnosis not present

## 2015-04-10 DIAGNOSIS — H1851 Endothelial corneal dystrophy: Secondary | ICD-10-CM | POA: Diagnosis not present

## 2015-04-10 DIAGNOSIS — H5203 Hypermetropia, bilateral: Secondary | ICD-10-CM | POA: Diagnosis not present

## 2015-04-10 DIAGNOSIS — H11153 Pinguecula, bilateral: Secondary | ICD-10-CM | POA: Diagnosis not present

## 2015-04-10 DIAGNOSIS — H2513 Age-related nuclear cataract, bilateral: Secondary | ICD-10-CM | POA: Diagnosis not present

## 2015-04-10 DIAGNOSIS — H18413 Arcus senilis, bilateral: Secondary | ICD-10-CM | POA: Diagnosis not present

## 2015-05-26 DIAGNOSIS — H18412 Arcus senilis, left eye: Secondary | ICD-10-CM | POA: Diagnosis not present

## 2015-05-26 DIAGNOSIS — H2512 Age-related nuclear cataract, left eye: Secondary | ICD-10-CM | POA: Diagnosis not present

## 2015-05-26 DIAGNOSIS — H18411 Arcus senilis, right eye: Secondary | ICD-10-CM | POA: Diagnosis not present

## 2015-05-26 DIAGNOSIS — H2511 Age-related nuclear cataract, right eye: Secondary | ICD-10-CM | POA: Diagnosis not present

## 2017-08-18 ENCOUNTER — Ambulatory Visit (HOSPITAL_COMMUNITY)
Admission: EM | Admit: 2017-08-18 | Discharge: 2017-08-18 | Disposition: A | Discharge status: Home / Self Care | Payer: Medicare Other | Attending: Family Medicine | Admitting: Family Medicine

## 2017-08-18 ENCOUNTER — Encounter (HOSPITAL_COMMUNITY): Payer: Self-pay | Admitting: Emergency Medicine

## 2017-08-18 DIAGNOSIS — M5431 Sciatica, right side: Secondary | ICD-10-CM | POA: Diagnosis not present

## 2017-08-18 MED ORDER — DICLOFENAC SODIUM 75 MG PO TBEC: 75.0000 mg | DELAYED_RELEASE_TABLET | Freq: Two times a day (BID) | ORAL | 0 refills | Status: DC

## 2017-08-18 MED ORDER — HYDROCODONE-ACETAMINOPHEN 5-325 MG PO TABS: 1.0000 | ORAL_TABLET | Freq: Four times a day (QID) | ORAL | 0 refills | Status: DC | PRN

## 2017-08-18 NOTE — Discharge Instructions (Signed)
Return if symptoms persist 

## 2017-08-18 NOTE — ED Triage Notes (Signed)
Pt reports right lower back pain that radiates into her hip and around to her right groin.

## 2017-08-18 NOTE — ED Provider Notes (Signed)
[facility]   [id number] 08/18/17 Arrival Time: 1406   SUBJECTIVE:  Pernell DupreJulia B Bentsen is a 84 y.o. female who presents to the urgent care with complaint of right lower back pain that radiates into her hip and around to her right groin No hip pain.  Minimal discomfort at rest.  No fever or incontinence. This episode began a week ago.  Has had this intermittently for a year, after picking up paraplegic spouse.  Past Medical History:  Diagnosis Date  . Hypertension    History reviewed. No pertinent family history. Social History   Socioeconomic History  . Marital status: Married    Spouse name: Not on file  . Number of children: 5  . Years of education: Not on file  . Highest education level: Not on file  Social Needs  . Financial resource strain: Not on file  . Food insecurity - worry: Not on file  . Food insecurity - inability: Not on file  . Transportation needs - medical: Not on file  . Transportation needs - non-medical: Not on file  Occupational History  . 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 Smoking status: Never Smoker  . Smokeless tobacco: Never Used  Substance and Sexual Activity  . Alcohol use: No  . Drug use: No  . Sexual activity: Not on file  Other Topics Concern  . Not on file  Social History Narrative  . Not on file   Current Meds  Medication Sig  . amLODipine (NORVASC) 10 MG tablet Take 10 mg by mouth daily.    Marland Kitchen. aspirin 81 MG EC tablet Take 81 mg by mouth daily.    . irbesartan (AVAPRO) 300 MG tablet Take 300 mg by mouth daily.  . potassium chloride (K-DUR) 10 MEQ tablet Take 10 mEq by mouth daily.   Allergies  Allergen Reactions  . Prednisone Nausea And Vomiting    Hallucinations      ROS: As per HPI, remainder of ROS negative.   OBJECTIVE:   Vitals:   08/18/17 1532  BP: (!) 154/72  Pulse: 77  Temp: 98.3 F (36.8 C)  TempSrc: Oral  SpO2: 99%     General appearance: alert;  no distress Eyes: PERRL; EOMI; conjunctiva normal HENT: normocephalic; atraumatic;  Neck: supple Abdomen: soft, non-tender; bowel sounds normal; no masses or organomegaly; no guarding or rebound tenderness Back: no CVA tenderness Extremities: no cyanosis or edema; symmetrical with no gross deformities; NO SLR signs Skin: warm and dry Neurologic: normal gait; grossly normal Psychological: alert and cooperative; normal mood and affect      Labs:  Results for orders placed or performed during the hospital encounter of 08/29/14  POCT urinalysis dip (device)  Result Value Ref Range   Glucose, UA NEGATIVE NEGATIVE mg/dL   Bilirubin Urine NEGATIVE NEGATIVE   Ketones, ur NEGATIVE NEGATIVE mg/dL   Specific Gravity, Urine 1.020 1.005 - 1.030   Hgb urine dipstick MODERATE (A) NEGATIVE   pH 5.5 5.0 - 8.0   Protein, ur NEGATIVE NEGATIVE mg/dL   Urobilinogen, UA 0.2 0.0 - 1.0 mg/dL   Nitrite NEGATIVE NEGATIVE   Leukocytes, UA SMALL (A) NEGATIVE  I-STAT, chem 8  Result Value Ref Range   Sodium 143 135 - 145 mmol/L   Potassium 3.9 3.5 - 5.1 mmol/L   Chloride 108 96 - 112 mmol/L   BUN 16 6 - 23 mg/dL   Creatinine, Ser 4.090.90 0.50 - 1.10 mg/dL  Glucose, Bld 121 (H) 70 - 99 mg/dL   Calcium, Ion 5.78 4.69 - 1.30 mmol/L   TCO2 19 0 - 100 mmol/L   Hemoglobin 13.9 12.0 - 15.0 g/dL   HCT 62.9 52.8 - 41.3 %    Labs Reviewed - No data to display  No results found.     ASSESSMENT & PLAN:  1. Sciatica of right side   This also has features of sacroileitis.  Meds ordered this encounter  Medications  . diclofenac (VOLTAREN) 75 MG EC tablet    Sig: Take 1 tablet (75 mg total) by mouth 2 (two) times daily.    Dispense:  14 tablet    Refill:  0  . HYDROcodone-acetaminophen (NORCO) 5-325 MG tablet    Sig: Take 1 tablet by mouth every 6 (six) hours as needed for moderate pain.    Dispense:  20 tablet    Refill:  0    Reviewed expectations re: course of current medical issues.  Questions answered. Outlined signs and symptoms indicating need for more acute intervention. Patient verbalized understanding. After Visit Summary given.    Procedures:      Elvina Sidle, MD 08/18/17 1555

## 2017-08-30 ENCOUNTER — Emergency Department (HOSPITAL_COMMUNITY): Payer: Medicare Other

## 2017-08-30 ENCOUNTER — Emergency Department (HOSPITAL_COMMUNITY)
Admission: EM | Admit: 2017-08-30 | Discharge: 2017-08-30 | Disposition: A | Discharge status: Home / Self Care | Payer: Medicare Other | Attending: Emergency Medicine | Admitting: Emergency Medicine

## 2017-08-30 ENCOUNTER — Encounter (HOSPITAL_COMMUNITY): Payer: Self-pay

## 2017-08-30 DIAGNOSIS — Z79899 Other long term (current) drug therapy: Secondary | ICD-10-CM | POA: Diagnosis not present

## 2017-08-30 DIAGNOSIS — Z7982 Long term (current) use of aspirin: Secondary | ICD-10-CM | POA: Insufficient documentation

## 2017-08-30 DIAGNOSIS — M25551 Pain in right hip: Secondary | ICD-10-CM | POA: Diagnosis not present

## 2017-08-30 DIAGNOSIS — M545 Low back pain, unspecified: Secondary | ICD-10-CM

## 2017-08-30 DIAGNOSIS — I10 Essential (primary) hypertension: Secondary | ICD-10-CM | POA: Insufficient documentation

## 2017-08-30 MED ORDER — TRAMADOL HCL 50 MG PO TABS
50.0000 mg | ORAL_TABLET | Freq: Once | ORAL | Status: AC
  Administered 2017-08-30: 50 mg via ORAL
  Filled 2017-08-30: qty 1

## 2017-08-30 MED ORDER — TRAMADOL HCL 50 MG PO TABS: 50.0000 mg | ORAL_TABLET | Freq: Three times a day (TID) | ORAL | 0 refills | Status: DC | PRN

## 2017-08-30 NOTE — Discharge Instructions (Signed)
It was our pleasure to provide your ER care today - we hope that you feel better.  Rest. Avoid bending at waist, or heavy lifting > 20 lbs for the next week.  Try heat/heating pad to sore area.  Take your diclofenac as need.    You may also take ultram as need for pain - no driving when taking.   Follow up with primary care doctor in 1 week.  Return to ER if worse, new symptoms, fevers, numbness/weakness, other concern.

## 2017-08-30 NOTE — ED Provider Notes (Signed)
[facility] Provider Note   CSN: [id number] Arrival date & time: 08/30/17  1334     History   Chief Complaint No chief complaint on file.   HPI Yvonne Riddle is a 84 y.o. female.  Patient c/o pain to right low back and hip area in the past week. 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 Denies fall. Pain constant, dull, moderate, worse w certain movements and position changes. Occasionally radiates towards buttock and thigh. No pain down leg. No numbness or weakness. No fever or chills. States was seen prior, but the hydrocodone makes her nauseated, so she will not take.    The history is provided by the patient.    Past Medical History:  Diagnosis Date  . Hypertension     Patient Active Problem List   Diagnosis Date Noted  . Hypertension 03/17/2011  . Cough 01/20/2011    History reviewed. No pertinent surgical history.  OB History    No data available       Home Medications    Prior to Admission medications   Medication Sig Start Date End Date Taking? Authorizing Provider  amLODipine (NORVASC) 10 MG tablet Take 10 mg by mouth daily.      [provider]  aspirin 81 MG EC tablet Take 81 mg by mouth daily.      [provider]  benzonatate (TESSALON) 200 MG capsule One every 6 hours as needed 05/10/11   Nyoka CowdenWert, Michael B, MD  diclofenac (VOLTAREN) 75 MG EC tablet Take 1 tablet (75 mg total) by mouth 2 (two) times daily. 08/18/17   Elvina SidleLauenstein, Kurt, MD  famotidine (PEPCID) 20 MG tablet One at bedtime 02/17/11 02/17/12  Nyoka CowdenWert, Michael B, MD  HYDROcodone-acetaminophen (NORCO) 5-325 MG tablet Take 1 tablet by mouth every 6 (six) hours as needed for moderate pain. 08/18/17   Elvina SidleLauenstein, Kurt, MD  irbesartan (AVAPRO) 300 MG tablet Take 300 mg by mouth daily.    [provider]  losartan-hydrochlorothiazide (HYZAAR) 100-12.5 MG per tablet Take 1  tablet by mouth daily.      [provider]  nebivolol (BYSTOLIC) 10 MG tablet One daily 03/17/11   Nyoka CowdenWert, Michael B, MD  potassium chloride (K-DUR) 10 MEQ tablet Take 10 mEq by mouth daily.    [provider]    Family History No family history on file.  Social History Social History   Tobacco Use  . Smoking status: Never Smoker  . Smokeless tobacco: Never Used  Substance Use Topics  . Alcohol use: No  . Drug use: No     Allergies   Prednisone   Review of Systems Review of Systems  Constitutional: Negative for chills and fever.  Cardiovascular: Negative for leg swelling.  Gastrointestinal: Negative for abdominal pain.  Genitourinary: Negative for flank pain.  Musculoskeletal: Positive for back pain.  Skin: Negative for rash and wound.  Neurological: Negative for weakness and numbness.     Physical Exam Updated Vital Signs BP (!) 141/68   Pulse 78   Temp 98.9 F (37.2 C) (Oral)   Resp 18   SpO2 99%   Physical Exam  Constitutional: She appears well-developed and well-nourished. No distress.  HENT:  Head: Atraumatic.  Eyes: Conjunctivae are normal. No scleral icterus.  Neck: Neck supple. No tracheal deviation present.  Cardiovascular: Normal rate and intact distal pulses.  Pulmonary/Chest: Effort normal. No respiratory distress.  Abdominal:  Normal appearance. She exhibits no distension. There is no tenderness.  No pulsatile mass.   Genitourinary:  Genitourinary Comments: No cva tenderness  Musculoskeletal: She exhibits no edema.  TLS spine non tender aligned. Good rom right hip and knee without pain. Distal pulses palp. No leg swelling.   Neurological: She is alert.  Speech normal. Motor intact RLE, stre 5/5. sens grossly intact. Straight leg raise neg.  Skin: Skin is warm and dry. No rash noted. She is not diaphoretic.  No skin changes or shingles in area of pain.   Psychiatric: She has a normal mood and affect.  Nursing note and vitals  reviewed.    ED Treatments / Results  Labs (all labs ordered are listed, but only abnormal results are displayed) Labs Reviewed - No data to display  EKG  EKG Interpretation None       Radiology Dg Hip Unilat  With Pelvis 2-3 Views Right  Result Date: 08/30/2017 CLINICAL DATA:  Constant right hip pain for 1 week. No reported injury. EXAM: DG HIP (WITH OR WITHOUT PELVIS) 2-3V RIGHT COMPARISON:  08/29/2014 FINDINGS: No acute fracture or hip dislocation is identified. There is mild superior hip joint space narrowing bilaterally. No focal osseous lesion is seen. Phleboliths are noted in the pelvis. IMPRESSION: Mild bilateral hip osteoarthrosis without acute osseous abnormality. Electronically Signed   By: Sebastian Ache M.D.   On: 08/30/2017 15:00    Procedures Procedures (including critical care time)  Medications Ordered in ED Medications - No data to display   Initial Impression / Assessment and Plan / ED Course  I have reviewed the triage vital signs and the nursing notes.  Pertinent labs & imaging results that were available during my care of the patient were reviewed by me and considered in my medical decision making (see chart for details).  Xrays.  Ultram 1po (pt has ride).  Will give rx for home, ultram prn pain.   Rec close outpt  pcp f/u.  xrays reviewed, deg change, no acute process.   Final Clinical Impressions(s) / ED Diagnoses   Final diagnoses:  None    ED Discharge Orders    None       Cathren Laine, MD 08/30/17 2021

## 2017-08-30 NOTE — ED Triage Notes (Signed)
Patient complains of ongoing right hip and back pain. [facility] for arthritis. 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

## 2018-03-17 IMAGING — DX DG HIP (WITH OR WITHOUT PELVIS) 2-3V*R*
3 series · 3 of 3 positions shown · non-contrast
Comparison: 08/29/2014

CLINICAL DATA: Constant right hip pain for 1 week. No reported
injury.

EXAM:
DG HIP (WITH OR WITHOUT PELVIS) 2-3V RIGHT

[t pelvis ap]
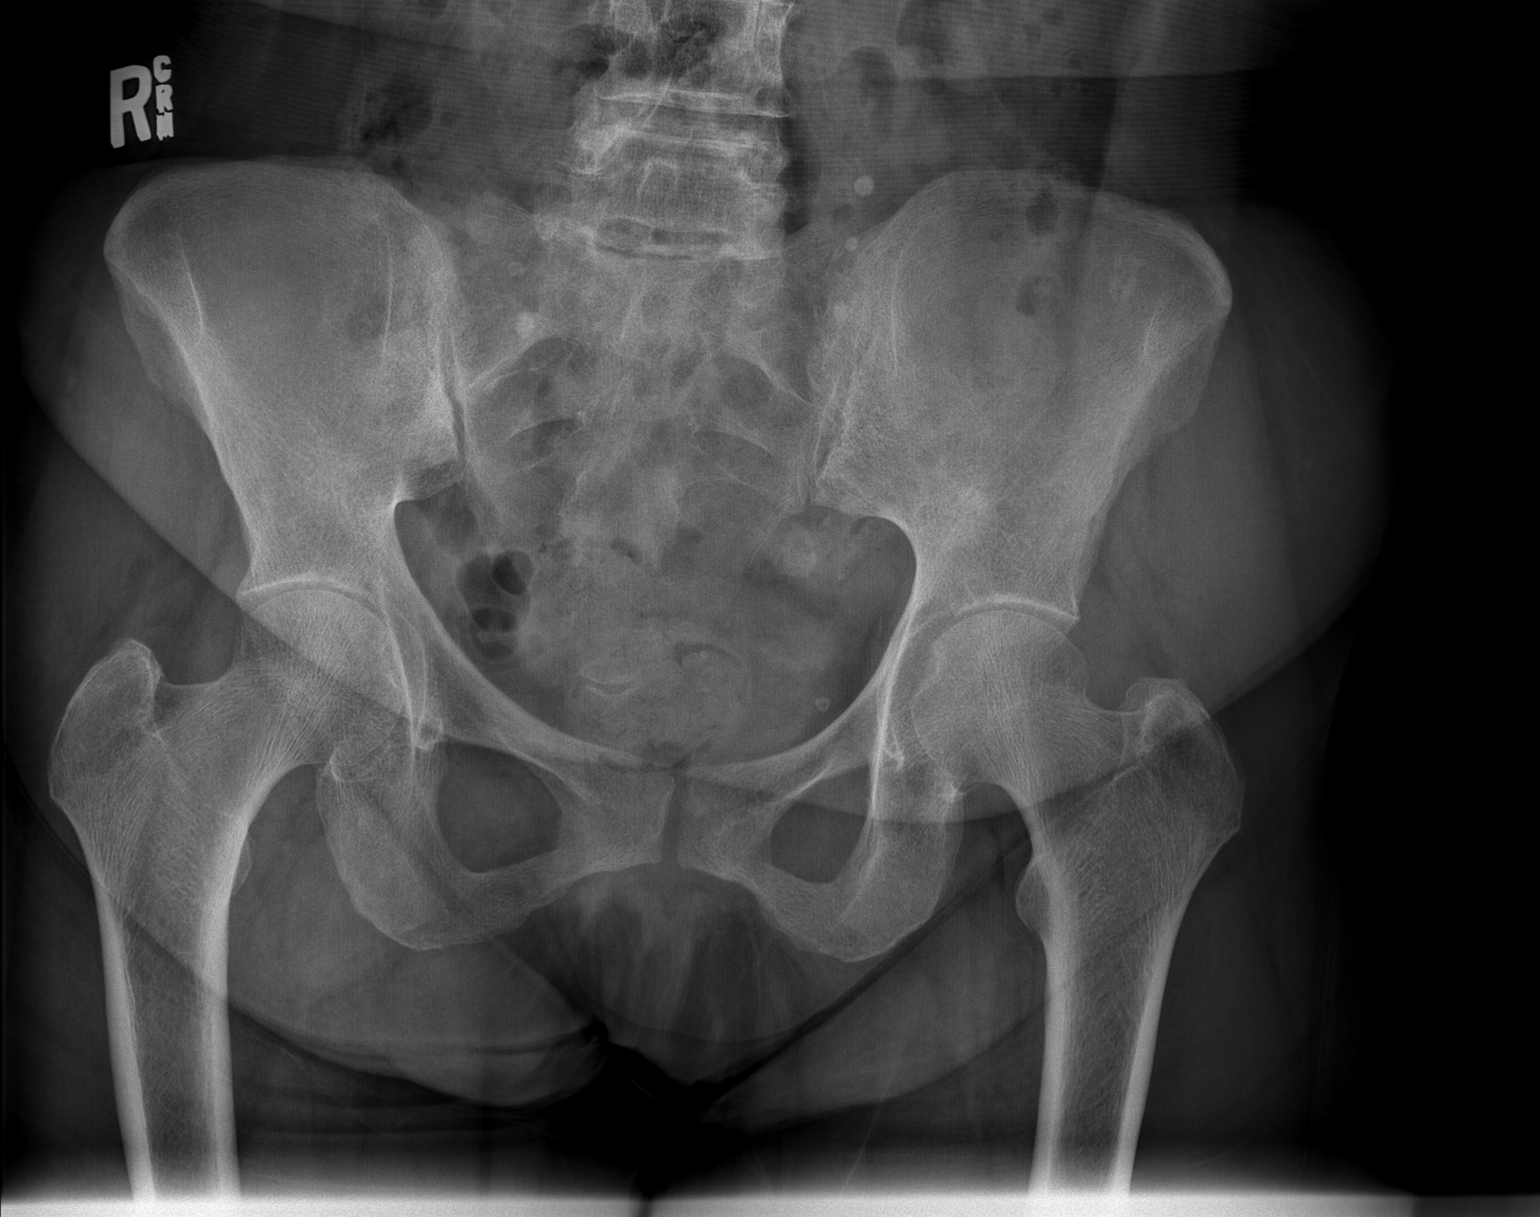

[t hip ap right]
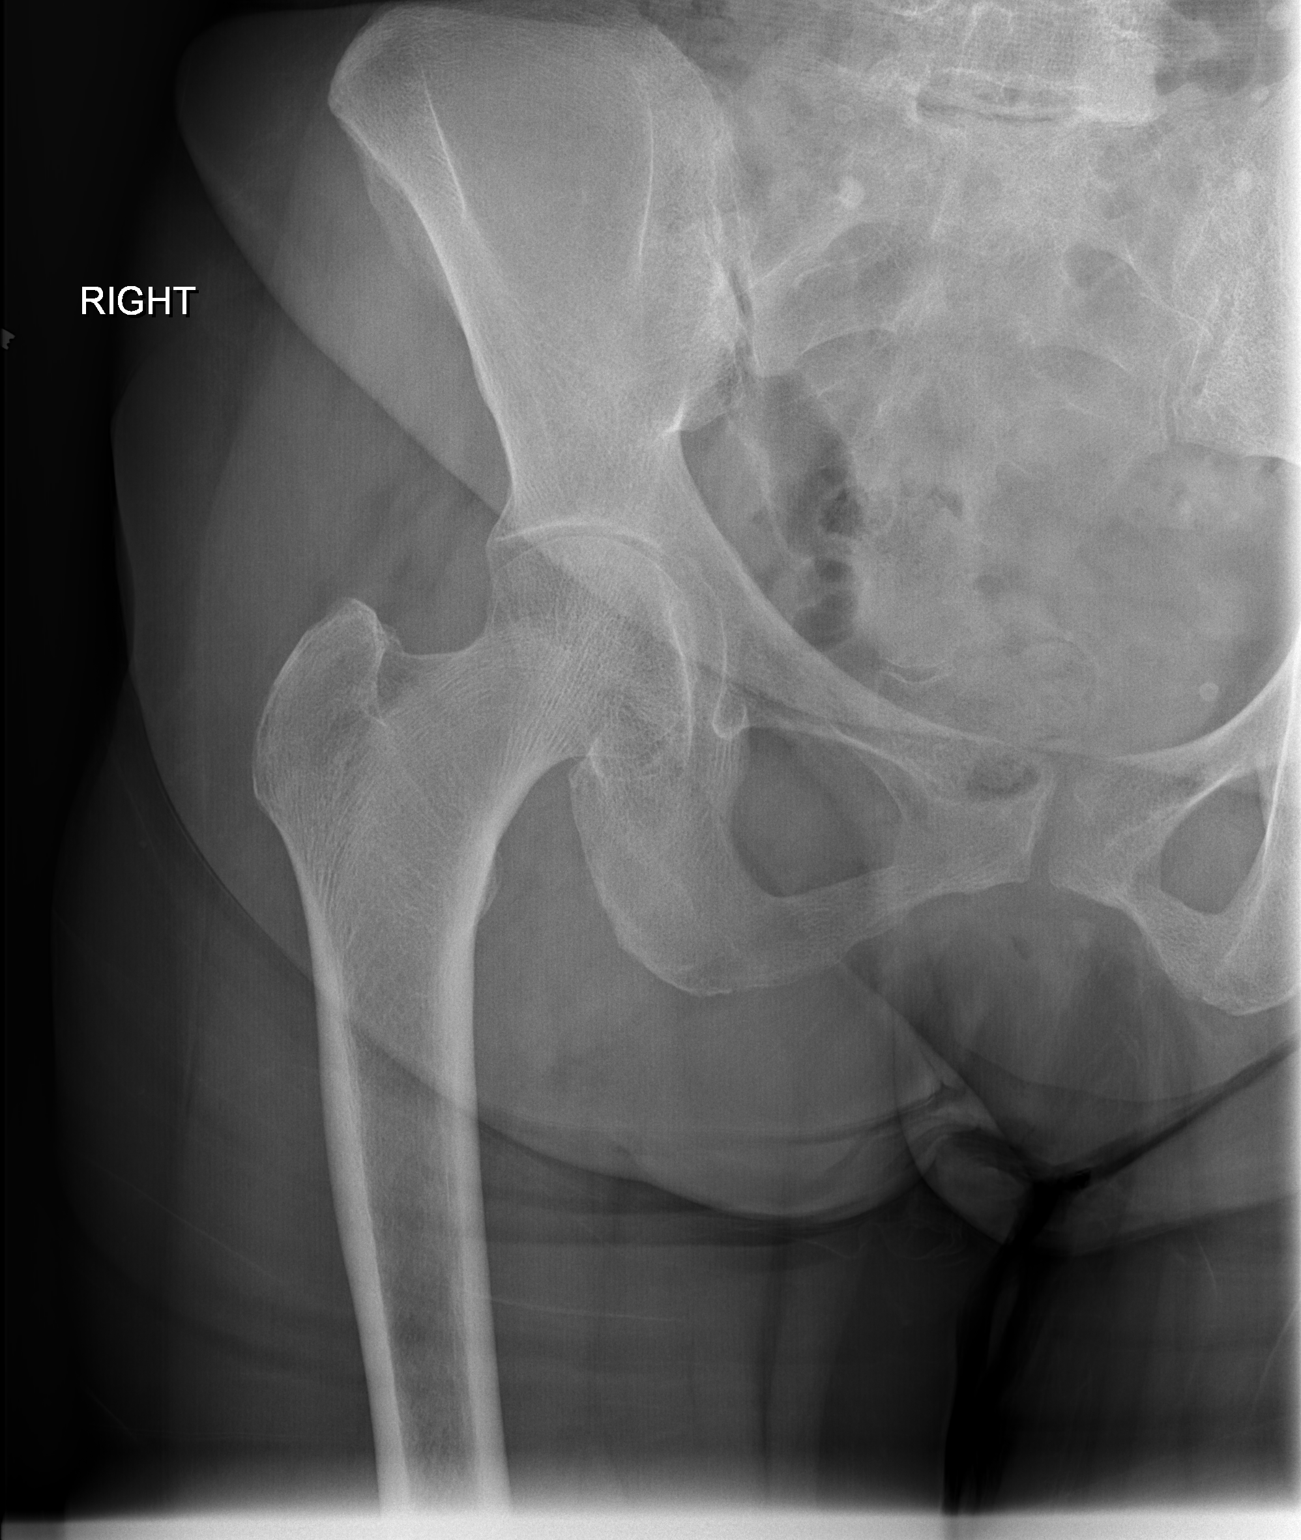

[t hip frog leg right]
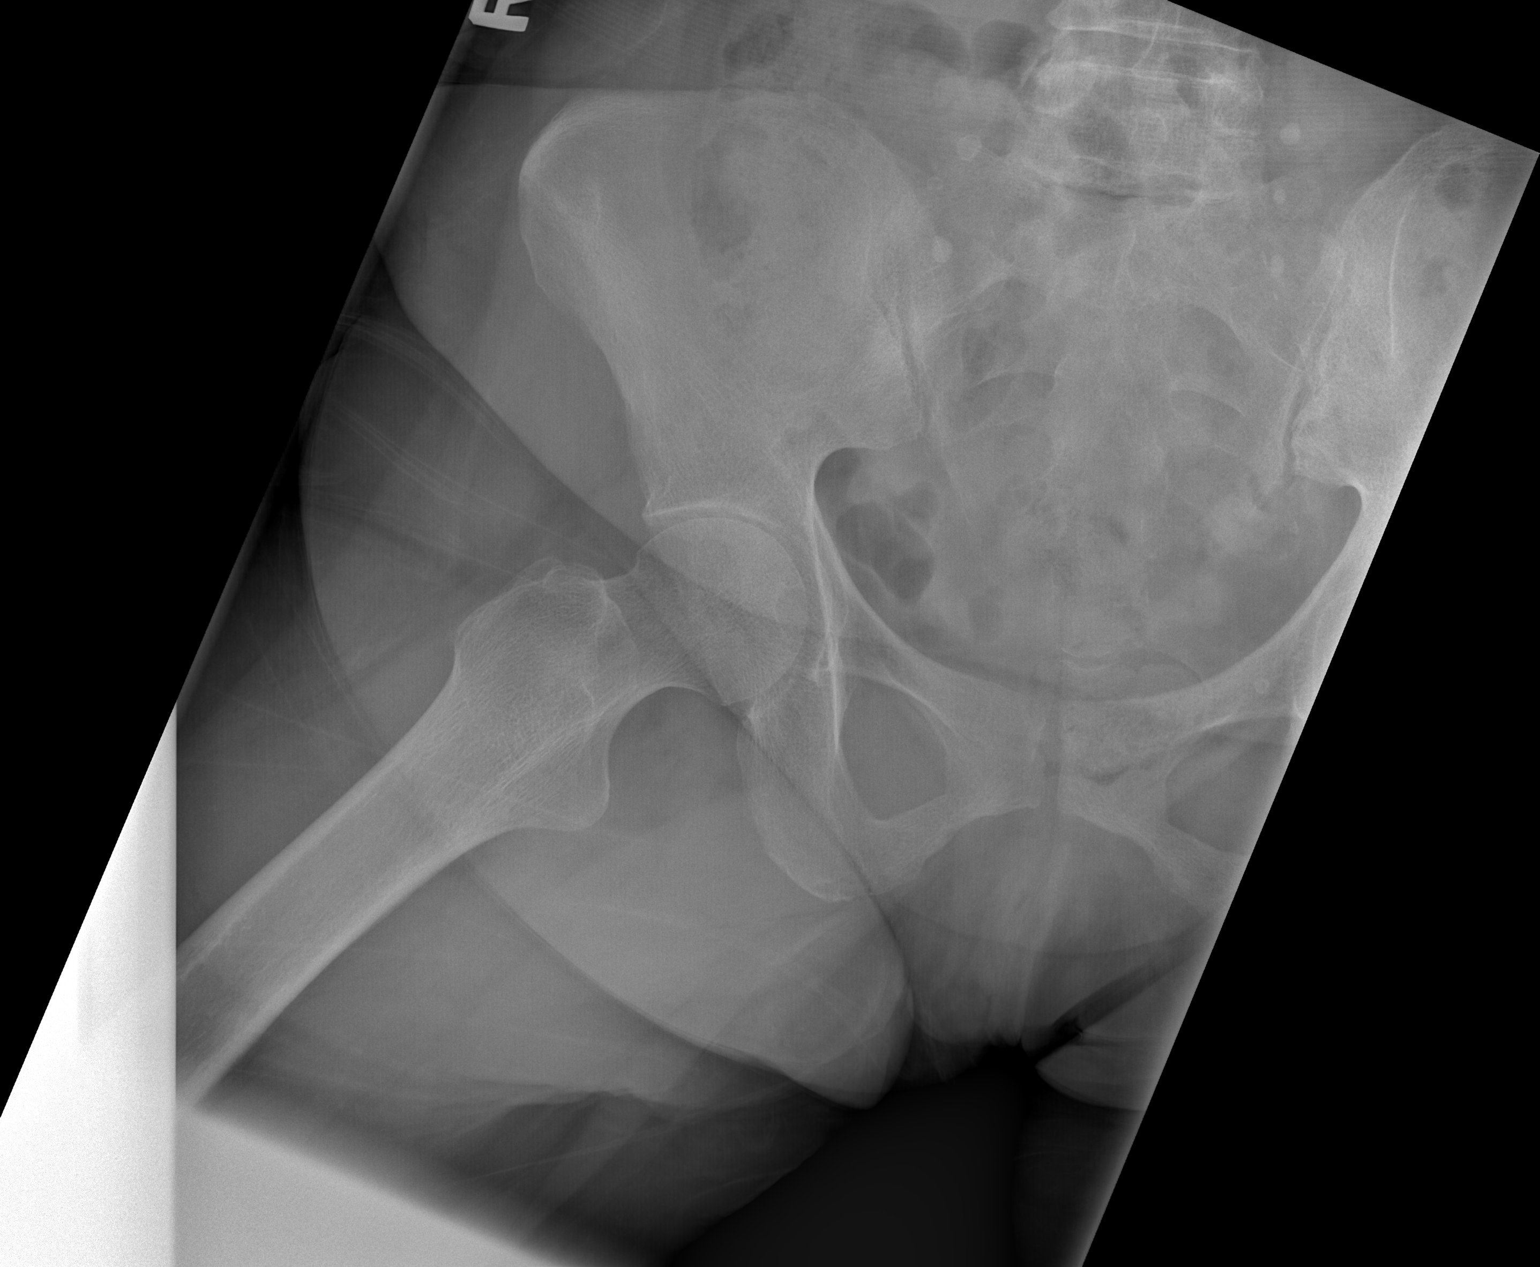

[3 of 3 positions shown; findings below may reference images not displayed]

FINDINGS: No acute fracture or hip dislocation is identified. There is mild
superior hip joint space narrowing bilaterally. No focal osseous
lesion is seen. Phleboliths are noted in the pelvis.
IMPRESSION: Mild bilateral hip osteoarthrosis without acute osseous abnormality.

## 2019-08-18 ENCOUNTER — Ambulatory Visit: Payer: Self-pay | Attending: Internal Medicine

## 2019-08-18 DIAGNOSIS — Z23 Encounter for immunization: Secondary | ICD-10-CM | POA: Insufficient documentation

## 2019-08-19 NOTE — Progress Notes (Signed)
   Covid-19 Vaccination Clinic  Name:  LOAN OGUIN    MRN: [id number] DOB: [birth year]  08/18/2019  Ms. Oneil was observed post Covid-19 immunization for 15 minutes without incidence. She was provided with Vaccine Information Sheet and instruction to access the V-Safe system.   Ms. Garmon was instructed to call 911 with any severe reactions post vaccine: Marland Kitchen Difficulty breathing  . Swelling of your face and throat  . A fast heartbeat  . A bad rash all over your body  . Dizziness and weakness    Immunizations Administered    Name Date Dose VIS Date Route   Moderna COVID-19 Vaccine 08/18/2019  1:54 PM 0.5 mL 06/25/2019 Intramuscular   Manufacturer: Gala Murdoch   Lot: [id number]   NDC: 64383-779-39      Documented on behalf of: P. Delford Field

## 2019-09-15 ENCOUNTER — Ambulatory Visit: Payer: Self-pay | Attending: Internal Medicine

## 2019-09-15 DIAGNOSIS — Z23 Encounter for immunization: Secondary | ICD-10-CM | POA: Insufficient documentation

## 2019-09-15 NOTE — Progress Notes (Signed)
   Covid-19 Vaccination Clinic  Name:  Yvonne Riddle    MRN: [id number] DOB: 1931/05/14  09/15/2019  [name] was observed post Covid-19 immunization for 15 minutes without incidence. She was provided with Vaccine Information Sheet and instruction to access the V-Safe system.   [name] was instructed to call 911 with any severe reactions post vaccine: Marland Kitchen Difficulty breathing  . Swelling of your face and throat  . A fast heartbeat  . A bad rash all over your body  . Dizziness and weakness    Immunizations Administered    Name Date Dose VIS Date Route   Moderna COVID-19 Vaccine 09/15/2019 11:53 AM 0.5 mL 06/25/2019 Intramuscular   Manufacturer: Moderna   Lot: [id number]   NDC: 35521-747-15

## 2020-01-07 ENCOUNTER — Emergency Department (HOSPITAL_COMMUNITY)
Admission: EM | Admit: 2020-01-07 | Discharge: 2020-01-08 | Disposition: A | Discharge status: Home / Self Care | Payer: Medicare PPO | Attending: Emergency Medicine | Admitting: Emergency Medicine

## 2020-01-07 ENCOUNTER — Encounter (HOSPITAL_COMMUNITY): Payer: Self-pay | Admitting: Emergency Medicine

## 2020-01-07 ENCOUNTER — Other Ambulatory Visit: Payer: Self-pay

## 2020-01-07 DIAGNOSIS — F419 Anxiety disorder, unspecified: Secondary | ICD-10-CM | POA: Diagnosis not present

## 2020-01-07 DIAGNOSIS — Z634 Disappearance and death of family member: Secondary | ICD-10-CM | POA: Insufficient documentation

## 2020-01-07 DIAGNOSIS — I10 Essential (primary) hypertension: Secondary | ICD-10-CM | POA: Insufficient documentation

## 2020-01-07 DIAGNOSIS — R05 Cough: Secondary | ICD-10-CM | POA: Insufficient documentation

## 2020-01-07 DIAGNOSIS — R5383 Other fatigue: Secondary | ICD-10-CM | POA: Diagnosis not present

## 2020-01-07 DIAGNOSIS — Z79899 Other long term (current) drug therapy: Secondary | ICD-10-CM | POA: Diagnosis not present

## 2020-01-07 DIAGNOSIS — R011 Cardiac murmur, unspecified: Secondary | ICD-10-CM | POA: Insufficient documentation

## 2020-01-07 DIAGNOSIS — R0789 Other chest pain: Secondary | ICD-10-CM | POA: Insufficient documentation

## 2020-01-07 DIAGNOSIS — Z8616 Personal history of COVID-19: Secondary | ICD-10-CM | POA: Diagnosis not present

## 2020-01-07 DIAGNOSIS — R799 Abnormal finding of blood chemistry, unspecified: Secondary | ICD-10-CM | POA: Diagnosis present

## 2020-01-07 LAB — CBC WITH DIFFERENTIAL/PLATELET
Abs Immature Granulocytes: 0.31 10*3/uL — ABNORMAL HIGH (ref 0.00–0.07)
Basophils Absolute: 0.1 10*3/uL (ref 0.0–0.1)
Basophils Relative: 1 %
Eosinophils Absolute: 0.3 10*3/uL (ref 0.0–0.5)
Eosinophils Relative: 3 %
HCT: 42.4 % (ref 36.0–46.0)
Hemoglobin: 13.4 g/dL (ref 12.0–15.0)
Immature Granulocytes: 3 %
Lymphocytes Relative: 27 %
Lymphs Abs: 3 10*3/uL (ref 0.7–4.0)
MCH: 30 pg (ref 26.0–34.0)
MCHC: 31.6 g/dL (ref 30.0–36.0)
MCV: 95.1 fL (ref 80.0–100.0)
Monocytes Absolute: 1 10*3/uL (ref 0.1–1.0)
Monocytes Relative: 9 %
Neutro Abs: 6.1 10*3/uL (ref 1.7–7.7)
Neutrophils Relative %: 57 %
Platelets: 225 10*3/uL (ref 150–400)
RBC: 4.46 MIL/uL (ref 3.87–5.11)
RDW: 13.9 % (ref 11.5–15.5)
WBC: 10.8 10*3/uL — ABNORMAL HIGH (ref 4.0–10.5)
nRBC: 0 % (ref 0.0–0.2)

## 2020-01-07 LAB — COMPREHENSIVE METABOLIC PANEL
ALT: 11 U/L (ref 0–44)
AST: 15 U/L (ref 15–41)
Albumin: 3.6 g/dL (ref 3.5–5.0)
Alkaline Phosphatase: 50 U/L (ref 38–126)
Anion gap: 11 (ref 5–15)
BUN: 18 mg/dL (ref 8–23)
CO2: 21 mmol/L — ABNORMAL LOW (ref 22–32)
Calcium: 9.4 mg/dL (ref 8.9–10.3)
Chloride: 109 mmol/L (ref 98–111)
Creatinine, Ser: 1.11 mg/dL — ABNORMAL HIGH (ref 0.44–1.00)
GFR calc Af Amer: 52 mL/min — ABNORMAL LOW (ref >60)
GFR calc non Af Amer: 45 mL/min — ABNORMAL LOW (ref >60)
Glucose, Bld: 112 mg/dL — ABNORMAL HIGH (ref 70–99)
Potassium: 3.7 mmol/L (ref 3.5–5.1)
Sodium: 141 mmol/L (ref 135–145)
Total Bilirubin: 0.5 mg/dL (ref 0.3–1.2)
Total Protein: 7.3 g/dL (ref 6.5–8.1)

## 2020-01-07 LAB — TROPONIN I (HIGH SENSITIVITY)
Troponin I (High Sensitivity): 8 ng/L (ref <18)
Troponin I (High Sensitivity): 11 ng/L (ref <18)

## 2020-01-07 LAB — URINALYSIS, ROUTINE W REFLEX MICROSCOPIC
Bilirubin Urine: NEGATIVE
Glucose, UA: NEGATIVE mg/dL
Ketones, ur: NEGATIVE mg/dL
Nitrite: NEGATIVE
Protein, ur: 30 mg/dL — AB
Specific Gravity, Urine: 1.014 (ref 1.005–1.030)
pH: 5 (ref 5.0–8.0)

## 2020-01-07 NOTE — ED Triage Notes (Signed)
Patient advised by her PCP to go to ER due to abnormal lab test this morning , denies pain or discomfort , no SOB , patient can not recall the test that is abnormal .

## 2020-01-08 ENCOUNTER — Emergency Department (HOSPITAL_COMMUNITY): Payer: Medicare PPO

## 2020-01-08 NOTE — ED Provider Notes (Signed)
[facility] EMERGENCY DEPARTMENT Provider Note   CSN: [id number] Arrival date & time: 01/07/20  1848    History Chief Complaint  Patient presents with   Abnormal Lab Test    Yvonne URIOSTEGUI is a 86 y.o. female with past medical history significant for hypertension, chronic cough who presents for evaluation of abnormal lab. Was seen by her PCP 1 month ago for fatigue and depression. Has had some intermittent chest tightness which she relates to anxiety. Has had increased anxiety and depression since her husband died in 09-01-2022 of this year. Patient admits to chronic cough after she was diagnosed with Covid. Had labs drawn at PCP office and told she had abnormal lab. She was told to proceed here to emergency department for evaluation. Patient denies any exertional or pleuritic chest pain. She denies any PND orthopnea. States she did have a new murmur at her PCP office. She denies any SI, HI, AVH. Denies headache, lightheadedness, dizziness, current chest pain, shortness of breath unilateral leg swelling, redness, warmth, abdominal pain, diarrhea, dysuria. Denies any aggravating or relieving factors.  History obtained from patient and past medical records. No interpreter used.   HPI     Past Medical History:  Diagnosis Date   Hypertension     Patient Active Problem List   Diagnosis Date Noted   Hypertension 03/17/2011   Cough 01/20/2011    History reviewed. No pertinent surgical history.   OB History   No obstetric history on file.     No family history on file.  Social History   Tobacco Use   Smoking status: Never Smoker   Smokeless tobacco: Never Used  Substance Use Topics   Alcohol use: No   Drug use: No    Home Medications Prior to Admission medications   Medication Sig Start Date End Date Taking? Authorizing Provider  amLODipine (NORVASC) 10 MG tablet Take 10 mg by mouth daily.     Yes [provider]  losartan (COZAAR)  100 MG tablet Take 100 mg by mouth daily. 09/14/19  Yes [provider]  potassium chloride (K-DUR) 10 MEQ tablet Take 10 mEq by mouth daily.   Yes [provider]  benzonatate (TESSALON) 200 MG capsule One every 6 hours as needed Patient not taking: Reported on 01/08/2020 05/10/11   Nyoka Cowden, MD  diclofenac (VOLTAREN) 75 MG EC tablet Take 1 tablet (75 mg total) by mouth 2 (two) times daily. Patient not taking: Reported on 01/08/2020 08/18/17   Elvina Sidle, MD  famotidine (PEPCID) 20 MG tablet One at bedtime Patient not taking: Reported on 01/08/2020 02/17/11 01/07/29  Nyoka Cowden, MD  HYDROcodone-acetaminophen (NORCO) 5-325 MG tablet Take 1 tablet by mouth every 6 (six) hours as needed for moderate pain. Patient not taking: Reported on 01/08/2020 08/18/17   Elvina Sidle, MD  nebivolol (BYSTOLIC) 10 MG tablet One daily Patient not taking: Reported on 01/08/2020 03/17/11   Nyoka Cowden, MD  traMADol (ULTRAM) 50 MG tablet Take 1 tablet (50 mg total) by mouth 3 (three) times daily as needed. Patient not taking: Reported on 01/08/2020 08/30/17   Cathren Laine, MD    Allergies    Prednisone  Review of Systems   Review of Systems  Constitutional: Positive for fatigue (For months). Negative for activity change, appetite change, chills, diaphoresis, fever and unexpected weight change.  HENT: Negative.   Respiratory: Positive for cough (Chronic) and chest tightness (Intermittent ). Negative for apnea, choking, shortness of breath, wheezing  and stridor.   Cardiovascular: Negative for chest pain, palpitations and leg swelling.  Gastrointestinal: Negative.   Genitourinary: Negative.   Musculoskeletal: Negative.   Skin: Negative.   Neurological: Negative.   All other systems reviewed and are negative.  Physical Exam Updated Vital Signs BP (!) 161/75 (BP Location: Right Arm)    Pulse 66    Temp 98 F (36.7 C) (Oral)    Resp 17    Ht 5\' 1"  (1.549 m)    Wt 75 kg    SpO2  98%    BMI 31.24 kg/m   Physical Exam Vitals and nursing note reviewed.  Constitutional:      General: She is not in acute distress.    Appearance: She is not ill-appearing, toxic-appearing or diaphoretic.  HENT:     Head: Normocephalic and atraumatic.     Jaw: There is normal jaw occlusion.     Nose: Nose normal.     Mouth/Throat:     Mouth: Mucous membranes are moist.  Neck:     Vascular: No carotid bruit or JVD.     Trachea: Trachea and phonation normal.  Cardiovascular:     Rate and Rhythm: Normal rate.     Pulses: Normal pulses.          Radial pulses are 2+ on the right side and 2+ on the left side.       Posterior tibial pulses are 2+ on the right side and 2+ on the left side.     Heart sounds: Murmur heard.   Pulmonary:     Effort: Pulmonary effort is normal.     Breath sounds: Normal breath sounds and air entry.     Comments: Speaks in full sentences without difficulty. No wheeze, rhonchi or rales Abdominal:     General: Bowel sounds are normal.     Tenderness: There is no abdominal tenderness. There is no right CVA tenderness, left CVA tenderness, guarding or rebound.     Hernia: No hernia is present.     Comments: Soft, nontender without rebound or guarding  Musculoskeletal:        General: Normal range of motion.     Cervical back: Full passive range of motion without pain, normal range of motion and neck supple.     Comments: Compartments soft. No bony tenderness. Tactile temperature to extremities. ' sign negative.  Feet:     Right foot:     Skin integrity: Skin integrity normal.     Left foot:     Skin integrity: Skin integrity normal.  Skin:    General: Skin is warm.     Capillary Refill: Capillary refill takes less than 2 seconds.     Comments: No edema, erythema or warmth  Neurological:     General: No focal deficit present.     Mental Status: She is alert and oriented to person, place, and time.     Comments: Cranial nerves II through XII  grossly intact. Ambulatory at baseline. At baseline mentation per family in room.    ED Results / Procedures / Treatments   Labs (all labs ordered are listed, but only abnormal results are displayed) Labs Reviewed  CBC WITH DIFFERENTIAL/PLATELET - Abnormal; Notable for the following components:      Result Value   WBC 10.8 (*)    Abs Immature Granulocytes 0.31 (*)    All other components within normal limits  COMPREHENSIVE METABOLIC PANEL - Abnormal; Notable for the following components:  CO2 21 (*)    Glucose, Bld 112 (*)    Creatinine, Ser 1.11 (*)    GFR calc non Af Amer 45 (*)    GFR calc Af Amer 52 (*)    All other components within normal limits  URINALYSIS, ROUTINE W REFLEX MICROSCOPIC - Abnormal; Notable for the following components:   APPearance HAZY (*)    Hgb urine dipstick MODERATE (*)    Protein, ur 30 (*)    Leukocytes,Ua MODERATE (*)    Bacteria, UA RARE (*)    All other components within normal limits  URINE CULTURE  TROPONIN I (HIGH SENSITIVITY)  TROPONIN I (HIGH SENSITIVITY)    EKG EKG Interpretation  Date/Time:  Tuesday January 07 2020 19:14:25 EDT Ventricular Rate:  72 PR Interval:  162 QRS Duration: 82 QT Interval:  396 QTC Calculation: 433 R Axis:   0 Text Interpretation: Normal sinus rhythm Possible Left atrial enlargement Possible Anterior infarct , age undetermined Abnormal ECG Confirmed by Ripley Fraise [phone]) on 01/08/2020 2:13:40 AM   Radiology DG Chest Portable 1 View  Result Date: 01/08/2020 CLINICAL DATA:  Chest pain EXAM: PORTABLE CHEST 1 VIEW COMPARISON:  03/03/2015 FINDINGS: Mild cardiomegaly. Aortic atherosclerosis. Linear scarring in the lower lungs bilaterally. No acute confluent airspace opacities or effusions. No acute bony abnormality. IMPRESSION: Areas of scarring bilaterally. Mild cardiomegaly. No active disease. Electronically Signed   By: Rolm Baptise M.D.   On: 01/08/2020 02:35    Procedures Procedures (including  critical care time)  Medications Ordered in ED Medications - No data to display  ED Course  I have reviewed the triage vital signs and the nursing notes.  Pertinent labs & imaging results that were available during my care of the patient were reviewed by me and considered in my medical decision making (see chart for details).  86 year old presents for evaluation of possible abnormal lab. Was seen by PCP earlier today. Patient initially unsure which abnormal labs she was sent here for. She is afebrile, nonseptic, not ill-appearing. Gets intermittent chest tightness which she relates to her depression and anxiety which has increased since January when her husband recently passed. She has no exertional or pleuritic chest pain. No Unilateral leg swelling, redness or warmth. She has no tachycardia, tachypnea or hypoxia. No recent surgery, pulsation or malignancy. She does have a murmur which she was recently diagnosed with by her PCP. Her abdomen is soft. Nonfocal neuro exam without deficits. Patient denies any current pain at this time.  Labs and imaging personally viewed interpreted CBC with leukocytosis at 84.6 Anabolic panel with mild hyperglycemia to 112, creatinine 1.11 Delta troponin negative Urinalysis with moderate leuks, rare bacteria. Patient denies any urinary complaints. Will send for culture. Nursing aware. Plan from chest with mild cardiomegaly, no infiltrates, pulmonary edema EKG without STEMI   Patient and family room prefer to follow-up outpatient. Will place referral for cardiology given new murmur. Work-up here in the emergency department reassuring. Patient without any chest pain, shortness of breath. Low suspicion for ACS, PE, dissection, infectious process. Discussed return precautions with patient family in room. They are agreeable to this.  The patient has been appropriately medically screened and/or stabilized in the ED. I have low suspicion for any other emergent medical  condition which would require further screening, evaluation or treatment in the ED or require inpatient management.  Patient is hemodynamically stable and in no acute distress.  Patient able to ambulate in department prior to ED.  Evaluation does not show  acute pathology that would require ongoing or additional emergent interventions while in the emergency department or further inpatient treatment.  I have discussed the diagnosis with the patient and answered all questions.  Pain is been managed while in the emergency department and patient has no further complaints prior to discharge.  Patient is comfortable with plan discussed in room and is stable for discharge at this time.  I have discussed strict return precautions for returning to the emergency department.  Patient was encouraged to follow-up with PCP/specialist refer to at discharge.  Patient seen eval by attending, Dr. Bebe Shaggy who agrees with above treatment, plan and disposition.    MDM Rules/Calculators/A&P                           Final Clinical Impression(s) / ED Diagnoses Final diagnoses:  Murmur  Fatigue, unspecified type    Rx / DC Orders ED Discharge Orders         Ordered    Ambulatory referral to Cardiology     Discontinue  Reprint     01/08/20 0311           Linwood Dibbles, PA-C 01/08/20 Arna Snipe, MD 01/08/20 [phone]

## 2020-01-08 NOTE — Discharge Instructions (Signed)
Follow up with Cardiology.  Return for new or worsening symptoms 

## 2020-01-08 NOTE — ED Notes (Signed)
Patient verbalizes understanding of discharge instructions. Opportunity for questioning and answers were provided. Armband removed by staff, pt discharged from ED stable & ambulatory  

## 2020-01-10 LAB — URINE CULTURE: Culture: 20000 — AB

## 2020-01-11 ENCOUNTER — Telehealth: Payer: Self-pay | Admitting: Emergency Medicine

## 2020-01-11 NOTE — Telephone Encounter (Signed)
Post ED Visit - Positive Culture Follow-up  Culture report reviewed by antimicrobial stewardship pharmacist: [facility] []  , Pharm.D. []  Enzo Bi, Pharm.D., BCPS AQ-ID []  , Pharm.D., BCPS []  Celedonio Miyamoto, .D., BCPS []  [place], .D., BCPS, AAHIVP []  Georgina Pillion, Pharm.D., BCPS, AAHIVP []  [address], PharmD, BCPS []  , PharmD, BCPS []  [place], PharmD, BCPS []  [address], PharmD []  , PharmD, BCPS []  Estella Husk, PharmD  Pharmacy Team []  Lysle Pearl, PharmD []  , PharmD []  Phillips Climes, PharmD []  , Rph []  Agapito Games) , PharmD []  Verlan Friends, PharmD []  , PharmD []  Mervyn Gay, PharmD []  , PharmD []  Vinnie Level, PharmD []  [facility], PharmD []  , PharmD []  Len Childs, PharmD   Positive urine culture Treated with none, asymptomatic,  no further patient follow-up is required at this time.  01/11/2020, 3:03 PM

## 2020-01-21 ENCOUNTER — Ambulatory Visit: Payer: Medicare PPO | Admitting: Cardiovascular Disease

## 2020-01-21 ENCOUNTER — Encounter: Payer: Self-pay | Admitting: Cardiovascular Disease

## 2020-01-21 ENCOUNTER — Other Ambulatory Visit: Payer: Self-pay

## 2020-01-21 DIAGNOSIS — R011 Cardiac murmur, unspecified: Secondary | ICD-10-CM

## 2020-01-21 DIAGNOSIS — I10 Essential (primary) hypertension: Secondary | ICD-10-CM | POA: Diagnosis not present

## 2020-01-21 NOTE — Assessment & Plan Note (Signed)
Yvonne Riddle was referred to me by her PCP because of an auscultated murmur.  She does have a 2/6 outflow tract murmur probably caused by aortic sclerosis.  She is doing completely asymptomatic.  I am going to get a 2D echo to further evaluate.

## 2020-01-21 NOTE — Assessment & Plan Note (Signed)
History of essential hypertension blood pressure measured today 116/74.  She is on losartan and amlodipine.

## 2020-01-21 NOTE — Progress Notes (Signed)
01/21/2020 Yvonne Riddle   21-Jan-1931  [id number]  Primary Physician Laurena Slimmer, MD (Inactive) Primary Cardiologist: Runell Gess MD FACP, [place], [place], [place]  HPI:  Yvonne Riddle is a 86 y.o. mildly overweight widowed African-American female (husband of 72 years died in 16-Aug-2022), mother of 4 living children (one son deceased), grandmother of 12 grandchildren who was referred by her PCP for evaluation of an auscultated murmur.  She is accompanied by her caregiver Luster Landsberg today.  Apparently her daughter Delanor Hermelinda Medicus is a patient of mine as well.  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  Her cardiac risk factor profile is only notable for treated hypertension.  She does not smoke.  There is no family history for heart disease.  She is never had a heart attack or stroke.  She denies chest pain or shortness of breath.   Current Meds  Medication Sig  . alendronate (FOSAMAX) 70 MG tablet Take 70 mg by mouth once a week. Take with a full glass of water on an empty stomach.  Marland Kitchen amLODipine (NORVASC) 10 MG tablet Take 10 mg by mouth daily.    . Cholecalciferol (VITAMIN D3 PO) Take by mouth.  . escitalopram (LEXAPRO) 5 MG tablet Take 5 mg by mouth daily.  Marland Kitchen KLOR-CON M20 20 MEQ tablet Take 20 mEq by mouth daily.  Marland Kitchen losartan (COZAAR) 100 MG tablet Take 100 mg by mouth daily.     Allergies  Allergen Reactions  . Prednisone Nausea And Vomiting    Hallucinations    Social History   Socioeconomic History  . Marital status: Married    Spouse name: Not on file  . Number of children: 5  . Years of education: Not on file  . Highest education level: Not on file  Occupational History  . Occupation: Retired     Comment: was a Architectural technologist  Tobacco Use  . Smoking status: Never Smoker  . Smokeless tobacco: Never Used  Substance and Sexual Activity  . Alcohol use: No  . Drug use: No  . Sexual activity: Not on file  Other Topics  Concern  . Not on file  Social History Narrative  . Not on file   Social Determinants of Health   Financial Resource Strain:   . Difficulty of Paying Living Expenses:   Food Insecurity:   . Worried About Programme researcher, broadcasting/film/video in the Last Year:   . Barista in the Last Year:   Transportation Needs:   . Freight forwarder (Medical):   Marland Kitchen Lack of Transportation (Non-Medical):   Physical Activity:   . Days of Exercise per Week:   . Minutes of Exercise per Session:   Stress:   . Feeling of Stress :   Social Connections:   . Frequency of Communication with Friends and Family:   . Frequency of Social Gatherings with Friends and Family:   . Attends Religious Services:   . Active Member of Clubs or Organizations:   . Attends Banker Meetings:   Marland Kitchen Marital Status:   Intimate Partner Violence:   . Fear of Current or Ex-Partner:   . Emotionally Abused:   Marland Kitchen Physically Abused:   . Sexually Abused:      Review of Systems: General: negative for chills, fever, night sweats or weight changes.  Cardiovascular: negative for chest pain, dyspnea on exertion, edema, orthopnea, palpitations, paroxysmal nocturnal dyspnea or shortness of breath  Dermatological: negative for rash Respiratory: negative for cough or wheezing Urologic: negative for hematuria Abdominal: negative for nausea, vomiting, diarrhea, bright red blood per rectum, melena, or hematemesis Neurologic: negative for visual changes, syncope, or dizziness All other systems reviewed and are otherwise negative except as noted above.    Blood pressure 116/74, pulse 67, height 5\' 6"  (1.676 m), weight 152 lb 12.8 oz (69.3 kg), SpO2 97 %.  General appearance: alert and no distress Neck: no adenopathy, no carotid bruit, no JVD, supple, symmetrical, trachea midline and thyroid not enlarged, symmetric, no tenderness/mass/nodules Lungs: clear to auscultation bilaterally Heart: 2/6 outflow tract murmur consistent with  aortic stenosis and/or sclerosis. Extremities: extremities normal, atraumatic, no cyanosis or edema Pulses: 2+ and symmetric Skin: Skin color, texture, turgor normal. No rashes or lesions Neurologic: Alert and oriented X 3, normal strength and tone. Normal symmetric reflexes. Normal coordination and gait  EKG not performed today  ASSESSMENT AND PLAN:   Hypertension History of essential hypertension blood pressure measured today 116/74.  She is on losartan and amlodipine.  Cardiac murmur Ms. Altadonna was referred to me by her PCP because of an auscultated murmur.  She does have a 2/6 outflow tract murmur probably caused by aortic sclerosis.  She is doing completely asymptomatic.  I am going to get a 2D echo to further evaluate.      MD FACP,FACC,FAHA, [facility] 01/21/2020 9:32 AM

## 2020-01-21 NOTE — Patient Instructions (Signed)
Medication Instructions:  NO CHANGE *If you need a refill on your cardiac medications before your next appointment, please call your pharmacy*   Lab Work: If you have labs (blood work) drawn today and your tests are completely normal, you will receive your results only by: . MyChart Message (if you have MyChart) OR . A paper copy in the mail If you have any lab test that is abnormal or we need to change your treatment, we will call you to review the results.   Testing/Procedures: Your physician has requested that you have an echocardiogram. Echocardiography is a painless test that uses sound waves to create images of your heart. It provides your doctor with information about the size and shape of your heart and how well your heart's chambers and valves are working. This procedure takes approximately one hour. There are no restrictions for this procedure.[address]   Follow-Up: At [facility], you and your health needs are our priority.  As part of our continuing mission to provide you with exceptional heart care, we have created designated Provider Care Teams.  These Care Teams include your primary Cardiologist (physician) and Advanced Practice Providers (APPs -  Physician Assistants and Nurse Practitioners) who all work together to provide you with the care you need, when you need it.  We recommend signing up for the patient portal called "MyChart".  Sign up information is provided on this After Visit Summary.  MyChart is used to connect with patients for Virtual Visits (Telemedicine).  Patients are able to view lab/test results, encounter notes, upcoming appointments, etc.  Non-urgent messages can be sent to your provider as well.   To learn more about what you can do with MyChart, go to [email or web address].    Your next appointment:    AS NEEDED 

## 2020-02-13 ENCOUNTER — Other Ambulatory Visit (HOSPITAL_COMMUNITY): Payer: Medicare PPO

## 2020-02-20 ENCOUNTER — Ambulatory Visit (HOSPITAL_COMMUNITY): Payer: Medicare PPO | Attending: Cardiovascular Disease

## 2020-02-20 ENCOUNTER — Other Ambulatory Visit: Payer: Self-pay

## 2020-02-20 DIAGNOSIS — R011 Cardiac murmur, unspecified: Secondary | ICD-10-CM | POA: Diagnosis not present

## 2020-02-20 LAB — ECHOCARDIOGRAM COMPLETE
Area-P 1/2: 2.22 cm2
P 1/2 time: 498 msec
S' Lateral: 2.1 cm

## 2020-02-27 ENCOUNTER — Other Ambulatory Visit: Payer: Self-pay

## 2020-02-27 DIAGNOSIS — R011 Cardiac murmur, unspecified: Secondary | ICD-10-CM

## 2020-02-27 DIAGNOSIS — I77810 Thoracic aortic ectasia: Secondary | ICD-10-CM

## 2020-02-27 DIAGNOSIS — R1013 Epigastric pain: Secondary | ICD-10-CM | POA: Diagnosis not present

## 2020-03-06 DIAGNOSIS — R1013 Epigastric pain: Secondary | ICD-10-CM | POA: Diagnosis not present

## 2020-03-06 DIAGNOSIS — R102 Pelvic and perineal pain: Secondary | ICD-10-CM | POA: Diagnosis not present

## 2020-03-06 DIAGNOSIS — K8020 Calculus of gallbladder without cholecystitis without obstruction: Secondary | ICD-10-CM | POA: Diagnosis not present

## 2020-03-19 DIAGNOSIS — Z961 Presence of intraocular lens: Secondary | ICD-10-CM | POA: Diagnosis not present

## 2020-03-19 DIAGNOSIS — H524 Presbyopia: Secondary | ICD-10-CM | POA: Diagnosis not present

## 2020-03-19 DIAGNOSIS — H1840 Unspecified corneal degeneration: Secondary | ICD-10-CM | POA: Diagnosis not present

## 2020-03-19 DIAGNOSIS — H52222 Regular astigmatism, left eye: Secondary | ICD-10-CM | POA: Diagnosis not present

## 2020-03-19 DIAGNOSIS — I10 Essential (primary) hypertension: Secondary | ICD-10-CM | POA: Diagnosis not present

## 2020-03-19 DIAGNOSIS — H5203 Hypermetropia, bilateral: Secondary | ICD-10-CM | POA: Diagnosis not present

## 2020-05-08 DIAGNOSIS — M160 Bilateral primary osteoarthritis of hip: Secondary | ICD-10-CM | POA: Diagnosis not present

## 2020-05-08 DIAGNOSIS — M5442 Lumbago with sciatica, left side: Secondary | ICD-10-CM | POA: Diagnosis not present

## 2020-06-01 DIAGNOSIS — M810 Age-related osteoporosis without current pathological fracture: Secondary | ICD-10-CM | POA: Diagnosis not present

## 2020-06-01 DIAGNOSIS — E559 Vitamin D deficiency, unspecified: Secondary | ICD-10-CM | POA: Diagnosis not present

## 2020-06-01 DIAGNOSIS — N1831 Chronic kidney disease, stage 3a: Secondary | ICD-10-CM | POA: Diagnosis not present

## 2020-06-01 DIAGNOSIS — I10 Essential (primary) hypertension: Secondary | ICD-10-CM | POA: Diagnosis not present

## 2020-06-23 ENCOUNTER — Other Ambulatory Visit: Payer: Self-pay | Admitting: Internal Medicine

## 2020-06-23 DIAGNOSIS — M810 Age-related osteoporosis without current pathological fracture: Secondary | ICD-10-CM

## 2020-10-01 ENCOUNTER — Other Ambulatory Visit: Payer: Medicare PPO

## 2020-10-20 ENCOUNTER — Ambulatory Visit
Admission: RE | Admit: 2020-10-20 | Discharge: 2020-10-20 | Disposition: A | Discharge status: Home / Self Care | Payer: Medicare PPO | Source: Ambulatory Visit | Attending: Internal Medicine | Admitting: Internal Medicine

## 2020-10-20 ENCOUNTER — Other Ambulatory Visit: Payer: Self-pay

## 2020-10-20 DIAGNOSIS — M810 Age-related osteoporosis without current pathological fracture: Secondary | ICD-10-CM

## 2020-12-08 DIAGNOSIS — I10 Essential (primary) hypertension: Secondary | ICD-10-CM | POA: Diagnosis not present

## 2020-12-08 DIAGNOSIS — E559 Vitamin D deficiency, unspecified: Secondary | ICD-10-CM | POA: Diagnosis not present

## 2020-12-08 DIAGNOSIS — M170 Bilateral primary osteoarthritis of knee: Secondary | ICD-10-CM | POA: Diagnosis not present

## 2020-12-08 DIAGNOSIS — Z23 Encounter for immunization: Secondary | ICD-10-CM | POA: Diagnosis not present

## 2020-12-08 DIAGNOSIS — K219 Gastro-esophageal reflux disease without esophagitis: Secondary | ICD-10-CM | POA: Diagnosis not present

## 2020-12-08 DIAGNOSIS — M47816 Spondylosis without myelopathy or radiculopathy, lumbar region: Secondary | ICD-10-CM | POA: Diagnosis not present

## 2020-12-08 DIAGNOSIS — Z1389 Encounter for screening for other disorder: Secondary | ICD-10-CM | POA: Diagnosis not present

## 2020-12-08 DIAGNOSIS — Z7189 Other specified counseling: Secondary | ICD-10-CM | POA: Diagnosis not present

## 2020-12-08 DIAGNOSIS — Z0000 Encounter for general adult medical examination without abnormal findings: Secondary | ICD-10-CM | POA: Diagnosis not present

## 2020-12-08 DIAGNOSIS — M160 Bilateral primary osteoarthritis of hip: Secondary | ICD-10-CM | POA: Diagnosis not present

## 2021-02-25 ENCOUNTER — Encounter (HOSPITAL_COMMUNITY): Payer: Self-pay | Admitting: Cardiovascular Disease

## 2021-02-25 ENCOUNTER — Encounter (HOSPITAL_COMMUNITY): Payer: Self-pay | Admitting: Cardiology

## 2021-02-25 ENCOUNTER — Other Ambulatory Visit (HOSPITAL_COMMUNITY): Payer: Medicare PPO

## 2021-02-25 NOTE — Progress Notes (Unsigned)
Patient ID: Yvonne Riddle, female   DOB: 1930-12-11, 87 y.o.   MRN: [id number]   Verified appointment "no show" status with Aaliyah at 10:25am.

## 2021-04-29 DIAGNOSIS — J45909 Unspecified asthma, uncomplicated: Secondary | ICD-10-CM | POA: Diagnosis not present

## 2021-04-29 DIAGNOSIS — M62830 Muscle spasm of back: Secondary | ICD-10-CM | POA: Diagnosis not present

## 2021-04-29 DIAGNOSIS — M47816 Spondylosis without myelopathy or radiculopathy, lumbar region: Secondary | ICD-10-CM | POA: Diagnosis not present

## 2021-04-29 DIAGNOSIS — M810 Age-related osteoporosis without current pathological fracture: Secondary | ICD-10-CM | POA: Diagnosis not present

## 2021-06-10 DIAGNOSIS — Z23 Encounter for immunization: Secondary | ICD-10-CM | POA: Diagnosis not present

## 2021-06-10 DIAGNOSIS — R058 Other specified cough: Secondary | ICD-10-CM | POA: Diagnosis not present

## 2021-06-10 DIAGNOSIS — J302 Other seasonal allergic rhinitis: Secondary | ICD-10-CM | POA: Diagnosis not present

## 2021-06-10 DIAGNOSIS — M810 Age-related osteoporosis without current pathological fracture: Secondary | ICD-10-CM | POA: Diagnosis not present

## 2021-06-10 DIAGNOSIS — N1831 Chronic kidney disease, stage 3a: Secondary | ICD-10-CM | POA: Diagnosis not present

## 2021-06-10 DIAGNOSIS — I10 Essential (primary) hypertension: Secondary | ICD-10-CM | POA: Diagnosis not present

## 2021-06-23 DIAGNOSIS — H04123 Dry eye syndrome of bilateral lacrimal glands: Secondary | ICD-10-CM | POA: Diagnosis not present

## 2021-08-16 DIAGNOSIS — J069 Acute upper respiratory infection, unspecified: Secondary | ICD-10-CM | POA: Diagnosis not present

## 2021-08-16 DIAGNOSIS — J45909 Unspecified asthma, uncomplicated: Secondary | ICD-10-CM | POA: Diagnosis not present

## 2021-09-14 DIAGNOSIS — J3489 Other specified disorders of nose and nasal sinuses: Secondary | ICD-10-CM | POA: Diagnosis not present

## 2021-09-14 DIAGNOSIS — Z8616 Personal history of COVID-19: Secondary | ICD-10-CM | POA: Diagnosis not present

## 2021-09-14 DIAGNOSIS — R053 Chronic cough: Secondary | ICD-10-CM | POA: Diagnosis not present

## 2021-10-10 NOTE — Progress Notes (Signed)
? ?New Patient Note ? ?RE: Yvonne Riddle MRN: [id number] DOB: 05-May-1931 ?Date of Office Visit: 10/11/2021 ? ?Consult requested by: Lorenda Ishihara,* ?Primary care provider: Lorenda Ishihara, MD ? ?Chief Complaint: Establish Care and Cough (Over 1 year--productive cough--worse at night--went to see ENT, a couple weeks ago, they did not find anything wrong) ? ?History of Present Illness: ?I had the pleasure of seeing Yvonne Riddle for initial evaluation at the [facility] on 10/12/2021. She is a 88 y.o. female, who is referred here by Lorenda Ishihara, MD for the evaluation of allergic rhinitis.  She is accompanied today by her daughter who provided/contributed to the history.  ? ?She reports symptoms of coughing with clear sputum, nocturnal awakenings for 2+ year. Current medications include none. which help. She reports not using aerochamber with inhalers. She tried the following inhalers: albuterol x 1 month. Main triggers are unknown. In the last month, frequency of symptoms: daily. Frequency of nocturnal symptoms: depends. Frequency of SABA use: 0x/week. Interference with physical activity: no. Sleep is disturbed. In the last 12 months, emergency room visits/urgent care visits/doctor office visits or hospitalizations due to respiratory issues: no. In the last 12 months, oral steroids courses: yes - 1 course. Lifetime history of hospitalization for respiratory issues: no. Prior intubations: no. History of pneumonia: no. She was not evaluated by allergist/pulmonologist in the past. Smoking exposure: no. Up to date with flu vaccine: yes. Up to date with pneumonia vaccine: yes. Up to date with COVID-19 vaccine: yes. Prior Covid-19 infection: yes in February 2021 which worsened the cough. ?History of reflux: taking omeprazole daily. ?No recent CXR.  ? ?She reports symptoms of PND, rhinorrhea. Symptoms have been going on for less than 1 years. No prior allergy testing.   ?Previous ENT evaluation: yes and no prior sinus surgery. ? ?2021 echo: ?"Normal LV systolic function with grade 1 diastolic dysfunction and a mildly dilated aortic root at 44 mm.  Repeat 12 months" ? ?09/14/2021 ENT visit: ?"Yvonne Riddle presents today with complaint of cough. Her daughter accompanies her on today's visit. She states that she has had chronic nonproductive cough of over a years duration. She seems to associate it with having had COVID but also it seems to exacerbate when she gets a COVID booster. She is seen her PCP numerous occasions and given various medications both prescription and OTC but this does not seem to help. States she had a chest x-ray last year which was clear. ? ?With no excessive or purulent mucus noted on endoscopy it does not appear that this is due to allergic drainage. However, I did suggest she could try Flonase if she wished. ?She is on omeprazole already and her larynx does not give a typical endoscopic appearance for reflux. ?We did discuss other potential etiologies including pulmonary issues and her blood pressure medication Cozaar. Going to discuss this further with her PCP. ?We will be happy to see back here as needed" ? ?Assessment and Plan: ?Yvonne Riddle is a 88 y.o. female with: ?Cough ?Coughing with clear sputum and nocturnal awakening for 2+ year.  She tried albuterol for 1 month with no benefit - had very poor inhaler technique.  No triggers noted.  COVID-19 infection in February 2021 which worsened the cough.  Takes omeprazole for reflux. No recent CXR. Mild diastolic dysfunction on echo in 2021. ENT evaluation unremarkable.  ?Today's skin prick testing showed: Negative to indoor/outdoor allergens. Negative to common foods. ?Today's spirometry was unremarkable with 12% improvement in  FEV1 post bronchodilator treatment.  Cleared clinically feeling unchanged. ?The most common causes of chronic cough include the following: upper airway cough syndrome (UACS) which is  caused by variety of rhinitis conditions; asthma; gastroesophageal reflux disease (GERD); chronic bronchitis from cigarette smoking or other inhaled environmental irritants; non-asthmatic eosinophilic bronchitis; and bronchiectasis.  ?In prospective studies, these conditions have accounted for up to 94% of the causes of chronic cough in immunocompetent adults.  ?Will do a trial of maintenance inhaler given prolonged symptoms.  ?Did not prescribe albuterol as there was a confusion about which inhaler to take daily and which to take as needed.  ?Get Chest X-ray. ?Daily controller medication(s): start Symbicort 2 puffs twice a day with spacer and rinse mouth afterwards. ?Spacer given and demonstrated proper use with inhaler. Patient understood technique and all questions/concerned were addressed.  ?Get spirometry at next visit. ? ?Chronic rhinitis ?Some postnasal drip and rhinorrhea for less than 1 year.  ENT evaluation was unremarkable. ?Today's skin prick testing showed: Negative to indoor/outdoor allergens.  ? If no improvement in coughing with the inhaler then will add on nasal sprays next. ? ?Heartburn ?See handout for lifestyle and dietary modifications. ?Continue omeprazole 20mg  in the morning. Nothing to eat or drink for 30 minutes.  ? ?Return in about 2 months (around 12/11/2021). ? ?Meds ordered this encounter  ?Medications  ? budesonide-formoterol (SYMBICORT) 80-4.5 MCG/ACT inhaler  ?  Sig: Inhale 2 puffs into the lungs in the morning and at bedtime. with spacer and rinse mouth afterwards.  ?  Dispense:  1 each  ?  Refill:  2  ? ?Lab Orders  ?No laboratory test(s) ordered today  ? ? ?Other allergy screening: ?Food allergy: no ?Medication allergy:  prednisone cause some nausea/vomiting. ?Hymenoptera allergy: no ?Urticaria: no ?Eczema:no ?History of recurrent infections suggestive of immunodeficency: no ? ?Diagnostics: ?Spirometry:  ?Tracings reviewed. Her effort: It was hard to get consistent efforts and  there is a question as to whether this reflects a maximal maneuver. ?FVC: 1.19L ?FEV1: 1.08L, 75% predicted ?FEV1/FVC ratio: 91% ?Interpretation: No overt abnormalities noted given today's efforts with 12% improvement in FEV1 post bronchodilator treatment. Clinically feeling unchanged.  ? ?Please see scanned spirometry results for details. ? ?Skin Testing: Environmental allergy panel and select foods. ?Negative to indoor/outdoor allergens. Negative to common foods.  ?Results discussed with patient/family. ? Airborne Adult Perc - 10/11/21 1000   ? ? Time Antigen Placed 1008   ? Allergen Manufacturer 10/13/21   ? Location Back   ? Number of Test 59   ? 1. Control-Buffer 50% Glycerol Negative   ? 2. Control-Histamine 1 mg/ml 3+   ? 3. Albumin saline Negative   ? 4. Bahia Negative   ? 5. Waynette Buttery Negative   ? 6. Johnson Negative   ? 7. Kentucky Blue Negative   ? 8. Meadow Fescue Negative   ? 9. Perennial Rye Negative   ? 10. Sweet Vernal Negative   ? 11. Timothy Negative   ? 12. Cocklebur Negative   ? 13. Burweed Marshelder Negative   ? 14. Ragweed, short Negative   ? 15. Ragweed, Giant Negative   ? 16. Plantain,  English Negative   ? 17. Lamb's Quarters Negative   ? 18. Sheep Sorrell Negative   ? 19. Rough Pigweed Negative   ? 20. Marsh Elder, Rough Negative   ? 21. Mugwort, Common Negative   ? 22. Ash mix Negative   ? 23. Birch mix Negative   ? 24.  Beech American Negative   ? 25. Box, Elder Negative   ? 26. Cedar, red Negative   ? 27. Cottonwood, Guinea-BissauEastern Negative   ? 28. Elm mix Negative   ? 29. Hickory Negative   ? 30. Maple mix Negative   ? 31. Oak, Guinea-BissauEastern mix Negative   ? 32. Pecan Pollen Negative   ? 33. Pine mix Negative   ? 34. Sycamore Eastern Negative   ? 35. Walnut, Black Pollen Negative   ? 36. Alternaria alternata Negative   ? 37. Cladosporium Herbarum Negative   ? 38. Aspergillus mix Negative   ? 39. Penicillium mix Negative   ? 40. Bipolaris sorokiniana (Helminthosporium) Negative   ? 41. Drechslera  spicifera (Curvularia) Negative   ? 42. Mucor plumbeus Negative   ? 43. Fusarium moniliforme Negative   ? 44. Aureobasidium pullulans (pullulara) Negative   ? 45. Rhizopus oryzae Negative   ? 46. Botrytis ciner

## 2021-10-11 ENCOUNTER — Other Ambulatory Visit: Payer: Self-pay

## 2021-10-11 ENCOUNTER — Ambulatory Visit: Payer: Medicare PPO | Admitting: Allergy

## 2021-10-11 ENCOUNTER — Encounter: Payer: Self-pay | Admitting: Allergy

## 2021-10-11 VITALS — BP 130/74 | HR 85 | Temp 98.2°F | Resp 18 | Ht 63.0 in | Wt 147.5 lb

## 2021-10-11 DIAGNOSIS — J310 Chronic rhinitis: Secondary | ICD-10-CM

## 2021-10-11 DIAGNOSIS — R12 Heartburn: Secondary | ICD-10-CM | POA: Diagnosis not present

## 2021-10-11 DIAGNOSIS — R053 Chronic cough: Secondary | ICD-10-CM

## 2021-10-11 MED ORDER — BUDESONIDE-FORMOTEROL FUMARATE 80-4.5 MCG/ACT IN AERO: 2.0000 | INHALATION_SPRAY | Freq: Two times a day (BID) | RESPIRATORY_TRACT | 2 refills | Status: DC

## 2021-10-11 NOTE — Patient Instructions (Addendum)
Today's skin testing showed: ?Negative to indoor/outdoor allergens. Negative to common foods.  ? ?Results given. ? ?Coughing ?The most common causes of chronic cough include the following: upper airway cough syndrome (UACS) which is caused by variety of rhinitis conditions; asthma; gastroesophageal reflux disease (GERD); chronic bronchitis from cigarette smoking or other inhaled environmental irritants; non-asthmatic eosinophilic bronchitis; and bronchiectasis.  ?In prospective studies, these conditions have accounted for up to 94% of the causes of chronic cough in immunocompetent adults.  ? ?Get Chest X-ray. ?Daily controller medication(s): start Symbicort 2 puffs twice a day with spacer and rinse mouth afterwards. ?Spacer given and demonstrated proper use with inhaler. Patient understood technique and all questions/concerned were addressed.  ?Coughing control goals:  ?Full participation in all desired activities (may need albuterol before activity) ?Albuterol use two times or less a week on average (not counting use with activity) ?Cough interfering with sleep two times or less a month ?Oral steroids no more than once a year ?No hospitalizations  ? ?Heartburn: ?See handout for lifestyle and dietary modifications. ?Continue omeprazole 20mg  in the morning. Nothing to eat or drink for 30 minutes.  ? ?Follow up in 2 months or sooner if needed.   ?

## 2021-10-12 ENCOUNTER — Encounter: Payer: Self-pay | Admitting: Allergy

## 2021-10-12 DIAGNOSIS — R12 Heartburn: Secondary | ICD-10-CM | POA: Insufficient documentation

## 2021-10-12 DIAGNOSIS — J310 Chronic rhinitis: Secondary | ICD-10-CM | POA: Insufficient documentation

## 2021-10-12 NOTE — Assessment & Plan Note (Signed)
Some postnasal drip and rhinorrhea for less than 1 year.  ENT evaluation was unremarkable. ?? Today's skin prick testing showed: Negative to indoor/outdoor allergens.  ??  If no improvement in coughing with the inhaler then will add on nasal sprays next. ?

## 2021-10-12 NOTE — Assessment & Plan Note (Signed)
?   See handout for lifestyle and dietary modifications. ?? Continue omeprazole 20mg  in the morning. Nothing to eat or drink for 30 minutes.  ?

## 2021-10-12 NOTE — Assessment & Plan Note (Addendum)
Coughing with clear sputum and nocturnal awakening for 2+ year.  She tried albuterol for 1 month with no benefit - had very poor inhaler technique.  No triggers noted.  COVID-19 infection in February 2021 which worsened the cough.  Takes omeprazole for reflux. No recent CXR. Mild diastolic dysfunction on echo in 2021. ENT evaluation unremarkable.  ?? Today's skin prick testing showed: Negative to indoor/outdoor allergens. Negative to common foods. ?? Today's spirometry was unremarkable with 12% improvement in FEV1 post bronchodilator treatment.  Cleared clinically feeling unchanged. ?? The most common causes of chronic cough include the following: upper airway cough syndrome (UACS) which is caused by variety of rhinitis conditions; asthma; gastroesophageal reflux disease (GERD); chronic bronchitis from cigarette smoking or other inhaled environmental irritants; non-asthmatic eosinophilic bronchitis; and bronchiectasis.  ?? In prospective studies, these conditions have accounted for up to 94% of the causes of chronic cough in immunocompetent adults.  ?? Will do a trial of maintenance inhaler given prolonged symptoms.  ?o Did not prescribe albuterol as there was a confusion about which inhaler to take daily and which to take as needed.  ?? Get Chest X-ray. ?? Daily controller medication(s): start Symbicort 68mcg 2 puffs twice a day with spacer and rinse mouth afterwards. ?? Spacer given and demonstrated proper use with inhaler. Patient understood technique and all questions/concerned were addressed.  ?? Get spirometry at next visit. ?

## 2021-11-04 ENCOUNTER — Ambulatory Visit
Admission: RE | Admit: 2021-11-04 | Discharge: 2021-11-04 | Disposition: A | Discharge status: Home / Self Care | Payer: Medicare PPO | Source: Ambulatory Visit | Attending: Allergy | Admitting: Allergy

## 2021-11-04 DIAGNOSIS — R053 Chronic cough: Secondary | ICD-10-CM

## 2021-12-13 DIAGNOSIS — M160 Bilateral primary osteoarthritis of hip: Secondary | ICD-10-CM | POA: Diagnosis not present

## 2021-12-13 DIAGNOSIS — Z0000 Encounter for general adult medical examination without abnormal findings: Secondary | ICD-10-CM | POA: Diagnosis not present

## 2021-12-13 DIAGNOSIS — I10 Essential (primary) hypertension: Secondary | ICD-10-CM | POA: Diagnosis not present

## 2021-12-13 DIAGNOSIS — F4321 Adjustment disorder with depressed mood: Secondary | ICD-10-CM | POA: Diagnosis not present

## 2021-12-13 DIAGNOSIS — J4521 Mild intermittent asthma with (acute) exacerbation: Secondary | ICD-10-CM | POA: Diagnosis not present

## 2021-12-13 DIAGNOSIS — R053 Chronic cough: Secondary | ICD-10-CM | POA: Diagnosis not present

## 2021-12-13 DIAGNOSIS — K219 Gastro-esophageal reflux disease without esophagitis: Secondary | ICD-10-CM | POA: Diagnosis not present

## 2021-12-13 DIAGNOSIS — N1831 Chronic kidney disease, stage 3a: Secondary | ICD-10-CM | POA: Diagnosis not present

## 2021-12-13 DIAGNOSIS — E559 Vitamin D deficiency, unspecified: Secondary | ICD-10-CM | POA: Diagnosis not present

## 2021-12-13 DIAGNOSIS — M810 Age-related osteoporosis without current pathological fracture: Secondary | ICD-10-CM | POA: Diagnosis not present

## 2022-01-24 NOTE — Progress Notes (Deleted)
Follow Up Note  RE: Yvonne Riddle MRN: [id number] DOB: 02-27-1931 Date of Office Visit: 01/26/2022  Referring provider: Lorenda Ishihara,* Primary care provider: Lorenda Ishihara, MD  Chief Complaint: No chief complaint on file.  History of Present Illness: I had the pleasure of seeing Yvonne Riddle for a follow up visit at the [facility] on 01/24/2022. She is a 88 y.o. female, who is being followed for chronic cough, rhinitis and heartburn. Her previous allergy office visit was on 10/11/2021 with Dr. Selena Batten. Today is a regular follow up visit.  Normal chest x-ray.  Cough Coughing with clear sputum and nocturnal awakening for 2+ year.  She tried albuterol for 1 month with no benefit - had very poor inhaler technique.  No triggers noted.  COVID-19 infection in February 2021 which worsened the cough.  Takes omeprazole for reflux. No recent CXR. Mild diastolic dysfunction on echo in 2021. ENT evaluation unremarkable.  Today's skin prick testing showed: Negative to indoor/outdoor allergens. Negative to common foods. Today's spirometry was unremarkable with 12% improvement in FEV1 post bronchodilator treatment.  Cleared clinically feeling unchanged. The most common causes of chronic cough include the following: upper airway cough syndrome (UACS) which is caused by variety of rhinitis conditions; asthma; gastroesophageal reflux disease (GERD); chronic bronchitis from cigarette smoking or other inhaled environmental irritants; non-asthmatic eosinophilic bronchitis; and bronchiectasis.  In prospective studies, these conditions have accounted for up to 94% of the causes of chronic cough in immunocompetent adults.  Will do a trial of maintenance inhaler given prolonged symptoms.  Did not prescribe albuterol as there was a confusion about which inhaler to take daily and which to take as needed.  Get Chest X-ray. Daily controller medication(s): start Symbicort  2 puffs twice a day with spacer and rinse mouth afterwards. Spacer given and demonstrated proper use with inhaler. Patient understood technique and all questions/concerned were addressed.  Get spirometry at next visit.   Chronic rhinitis Some postnasal drip and rhinorrhea for less than 1 year.  ENT evaluation was unremarkable. Today's skin prick testing showed: Negative to indoor/outdoor allergens.   If no improvement in coughing with the inhaler then will add on nasal sprays next.   Heartburn See handout for lifestyle and dietary modifications. Continue omeprazole 20mg  in the morning. Nothing to eat or drink for 30 minutes.    Return in about 2 months (around 12/11/2021).  Assessment and Plan: Yvonne Riddle is a 88 y.o. female with: No problem-specific Assessment & Plan notes found for this encounter.  No follow-ups on file.  No orders of the defined types were placed in this encounter.  Lab Orders  No laboratory test(s) ordered today    Diagnostics: Spirometry:  Tracings reviewed. Her effort: {Blank single:19197::"Good reproducible efforts.","It was hard to get consistent efforts and there is a question as to whether this reflects a maximal maneuver.","Poor effort, data can not be interpreted."} FVC: ***L FEV1: ***L, ***% predicted FEV1/FVC ratio: ***% Interpretation: {Blank single:19197::"Spirometry consistent with mild obstructive disease","Spirometry consistent with moderate obstructive disease","Spirometry consistent with severe obstructive disease","Spirometry consistent with possible restrictive disease","Spirometry consistent with mixed obstructive and restrictive disease","Spirometry uninterpretable due to technique","Spirometry consistent with normal pattern","No overt abnormalities noted given today's efforts"}.  Please see scanned spirometry results for details.  Skin Testing: {Blank single:19197::"Select foods","Environmental allergy panel","Environmental allergy panel  and select foods","Food allergy panel","None","Deferred due to recent antihistamines use"}. *** Results discussed with patient/family.   Medication List:  Current Outpatient Medications  Medication Sig Dispense Refill  alendronate (FOSAMAX) 70 MG tablet Take 70 mg by mouth once a week. Take with a full glass of water on an empty stomach.     amLODipine (NORVASC) 10 MG tablet Take 10 mg by mouth daily.       budesonide-formoterol (SYMBICORT) 80-4.5 MCG/ACT inhaler Inhale 2 puffs into the lungs in the morning and at bedtime. with spacer and rinse mouth afterwards. 1 each 2   Cholecalciferol (VITAMIN D3 PO) Take by mouth.     Cholecalciferol 125 MCG (5000 UT) capsule Take by mouth.     escitalopram (LEXAPRO) 5 MG tablet Take 5 mg by mouth daily.     KLOR-CON M20 20 MEQ tablet Take 20 mEq by mouth daily.     losartan (COZAAR) 100 MG tablet Take 100 mg by mouth daily.     omeprazole (PRILOSEC) 20 MG capsule TAKE 1 CAPSULE BY MOUTH EVERY DAY 30 MINUTES BEFORE MORNING MEAL     No current facility-administered medications for this visit.   Allergies: Allergies  Allergen Reactions   Prednisone Nausea And Vomiting    Hallucinations   I reviewed her past medical history, social history, family history, and environmental history and no significant changes have been reported from her previous visit.  Review of Systems  Constitutional:  Negative for appetite change, chills, fever and unexpected weight change.  HENT:  Positive for postnasal drip and rhinorrhea. Negative for congestion.   Eyes:  Negative for itching.  Respiratory:  Positive for cough. Negative for chest tightness, shortness of breath and wheezing.   Cardiovascular:  Negative for chest pain.  Gastrointestinal:  Negative for abdominal pain.  Genitourinary:  Negative for difficulty urinating.  Skin:  Negative for rash.  Allergic/Immunologic: Negative for environmental allergies and food allergies.  Neurological:  Negative for  headaches.    Objective: There were no vitals taken for this visit. There is no height or weight on file to calculate BMI. Physical Exam Vitals and nursing note reviewed.  Constitutional:      Appearance: Normal appearance. She is well-developed.  HENT:     Head: Normocephalic and atraumatic.     Right Ear: External ear normal. There is impacted cerumen.     Left Ear: Tympanic membrane and external ear normal.     Nose: Nose normal.     Mouth/Throat:     Mouth: Mucous membranes are moist.     Pharynx: Oropharynx is clear.  Eyes:     Conjunctiva/sclera: Conjunctivae normal.  Cardiovascular:     Rate and Rhythm: Normal rate and regular rhythm.     Heart sounds: Murmur heard.     No friction rub. No gallop.  Pulmonary:     Effort: Pulmonary effort is normal.     Breath sounds: Normal breath sounds. No wheezing, rhonchi or rales.  Musculoskeletal:     Cervical back: Neck supple.  Skin:    General: Skin is warm.     Findings: No rash.  Neurological:     Mental Status: She is alert and oriented to person, place, and time.  Psychiatric:        Behavior: Behavior normal.    Previous notes and tests were reviewed. The plan was reviewed with the patient/family, and all questions/concerned were addressed.  It was my pleasure to see Avereigh today and participate in her care. Please feel free to contact me with any questions or concerns.  Sincerely,  Wyline Mood, DO Allergy & Immunology  [facility]  office: [place] office: [phone]

## 2022-01-26 ENCOUNTER — Ambulatory Visit: Payer: Medicare PPO | Admitting: Allergy

## 2022-01-26 DIAGNOSIS — J310 Chronic rhinitis: Secondary | ICD-10-CM

## 2022-01-26 DIAGNOSIS — M5430 Sciatica, unspecified side: Secondary | ICD-10-CM | POA: Insufficient documentation

## 2022-01-26 DIAGNOSIS — J302 Other seasonal allergic rhinitis: Secondary | ICD-10-CM | POA: Insufficient documentation

## 2022-01-26 DIAGNOSIS — I10 Essential (primary) hypertension: Secondary | ICD-10-CM | POA: Diagnosis not present

## 2022-01-26 DIAGNOSIS — M810 Age-related osteoporosis without current pathological fracture: Secondary | ICD-10-CM | POA: Insufficient documentation

## 2022-01-26 DIAGNOSIS — M47817 Spondylosis without myelopathy or radiculopathy, lumbosacral region: Secondary | ICD-10-CM | POA: Insufficient documentation

## 2022-01-26 DIAGNOSIS — F3341 Major depressive disorder, recurrent, in partial remission: Secondary | ICD-10-CM | POA: Insufficient documentation

## 2022-01-26 DIAGNOSIS — R053 Chronic cough: Secondary | ICD-10-CM

## 2022-01-26 DIAGNOSIS — M1610 Unilateral primary osteoarthritis, unspecified hip: Secondary | ICD-10-CM | POA: Insufficient documentation

## 2022-01-26 DIAGNOSIS — M179 Osteoarthritis of knee, unspecified: Secondary | ICD-10-CM | POA: Insufficient documentation

## 2022-01-26 DIAGNOSIS — K219 Gastro-esophageal reflux disease without esophagitis: Secondary | ICD-10-CM | POA: Insufficient documentation

## 2022-01-26 DIAGNOSIS — J4521 Mild intermittent asthma with (acute) exacerbation: Secondary | ICD-10-CM | POA: Insufficient documentation

## 2022-01-26 DIAGNOSIS — G8929 Other chronic pain: Secondary | ICD-10-CM | POA: Insufficient documentation

## 2022-01-26 DIAGNOSIS — N1831 Chronic kidney disease, stage 3a: Secondary | ICD-10-CM | POA: Insufficient documentation

## 2022-01-26 DIAGNOSIS — J45909 Unspecified asthma, uncomplicated: Secondary | ICD-10-CM | POA: Insufficient documentation

## 2022-04-20 DIAGNOSIS — Z961 Presence of intraocular lens: Secondary | ICD-10-CM | POA: Diagnosis not present

## 2022-04-20 DIAGNOSIS — H18513 Endothelial corneal dystrophy, bilateral: Secondary | ICD-10-CM | POA: Diagnosis not present

## 2022-05-16 DIAGNOSIS — J069 Acute upper respiratory infection, unspecified: Secondary | ICD-10-CM | POA: Diagnosis not present

## 2022-05-16 DIAGNOSIS — R059 Cough, unspecified: Secondary | ICD-10-CM | POA: Diagnosis not present

## 2022-05-16 DIAGNOSIS — Z03818 Encounter for observation for suspected exposure to other biological agents ruled out: Secondary | ICD-10-CM | POA: Diagnosis not present

## 2022-05-30 DIAGNOSIS — H18513 Endothelial corneal dystrophy, bilateral: Secondary | ICD-10-CM | POA: Diagnosis not present

## 2022-08-29 DIAGNOSIS — I129 Hypertensive chronic kidney disease with stage 1 through stage 4 chronic kidney disease, or unspecified chronic kidney disease: Secondary | ICD-10-CM | POA: Diagnosis not present

## 2022-08-29 DIAGNOSIS — M170 Bilateral primary osteoarthritis of knee: Secondary | ICD-10-CM | POA: Diagnosis not present

## 2022-08-29 DIAGNOSIS — N1831 Chronic kidney disease, stage 3a: Secondary | ICD-10-CM | POA: Diagnosis not present

## 2022-08-29 DIAGNOSIS — M810 Age-related osteoporosis without current pathological fracture: Secondary | ICD-10-CM | POA: Diagnosis not present

## 2022-08-29 DIAGNOSIS — R059 Cough, unspecified: Secondary | ICD-10-CM | POA: Diagnosis not present

## 2022-08-29 DIAGNOSIS — Z23 Encounter for immunization: Secondary | ICD-10-CM | POA: Diagnosis not present

## 2022-08-29 DIAGNOSIS — I10 Essential (primary) hypertension: Secondary | ICD-10-CM | POA: Diagnosis not present

## 2022-08-29 DIAGNOSIS — M47816 Spondylosis without myelopathy or radiculopathy, lumbar region: Secondary | ICD-10-CM | POA: Diagnosis not present

## 2022-08-29 DIAGNOSIS — F3341 Major depressive disorder, recurrent, in partial remission: Secondary | ICD-10-CM | POA: Diagnosis not present

## 2022-11-28 DIAGNOSIS — Z961 Presence of intraocular lens: Secondary | ICD-10-CM | POA: Diagnosis not present

## 2022-11-28 DIAGNOSIS — H18513 Endothelial corneal dystrophy, bilateral: Secondary | ICD-10-CM | POA: Diagnosis not present

## 2022-11-28 DIAGNOSIS — H0288B Meibomian gland dysfunction left eye, upper and lower eyelids: Secondary | ICD-10-CM | POA: Diagnosis not present

## 2022-11-28 DIAGNOSIS — H0288A Meibomian gland dysfunction right eye, upper and lower eyelids: Secondary | ICD-10-CM | POA: Diagnosis not present

## 2022-12-20 DIAGNOSIS — F3341 Major depressive disorder, recurrent, in partial remission: Secondary | ICD-10-CM | POA: Diagnosis not present

## 2022-12-20 DIAGNOSIS — K219 Gastro-esophageal reflux disease without esophagitis: Secondary | ICD-10-CM | POA: Diagnosis not present

## 2022-12-20 DIAGNOSIS — M47816 Spondylosis without myelopathy or radiculopathy, lumbar region: Secondary | ICD-10-CM | POA: Diagnosis not present

## 2022-12-20 DIAGNOSIS — N1831 Chronic kidney disease, stage 3a: Secondary | ICD-10-CM | POA: Diagnosis not present

## 2022-12-20 DIAGNOSIS — I10 Essential (primary) hypertension: Secondary | ICD-10-CM | POA: Diagnosis not present

## 2022-12-20 DIAGNOSIS — M170 Bilateral primary osteoarthritis of knee: Secondary | ICD-10-CM | POA: Diagnosis not present

## 2022-12-20 DIAGNOSIS — Z0000 Encounter for general adult medical examination without abnormal findings: Secondary | ICD-10-CM | POA: Diagnosis not present

## 2022-12-20 DIAGNOSIS — J45909 Unspecified asthma, uncomplicated: Secondary | ICD-10-CM | POA: Diagnosis not present

## 2022-12-20 DIAGNOSIS — Z1331 Encounter for screening for depression: Secondary | ICD-10-CM | POA: Diagnosis not present

## 2022-12-20 DIAGNOSIS — M810 Age-related osteoporosis without current pathological fracture: Secondary | ICD-10-CM | POA: Diagnosis not present

## 2022-12-26 ENCOUNTER — Other Ambulatory Visit: Payer: Self-pay | Admitting: Internal Medicine

## 2022-12-26 DIAGNOSIS — M810 Age-related osteoporosis without current pathological fracture: Secondary | ICD-10-CM

## 2022-12-26 NOTE — Progress Notes (Signed)
   [address] [place] [address] Dept: [phone]  FOLLOW UP NOTE  Patient ID: Yvonne Riddle, female    DOB: 16-Mar-1931  Age: 89 y.o. MRN: [id number] Date of Office Visit: 12/27/2022  Assessment  Chief Complaint: No chief complaint on file.  HPI Yvonne Riddle is a 89 year old female who presents to the clinic for follow-up visit.  She was last seen in this clinic on 10/11/2021 by Dr. Selena Batten as a new patient for evaluation of chronic cough, chronic rhinitis, and reflux.  On 10/11/2021 she had negative environmental allergy skin testing.   Drug Allergies:  Allergies  Allergen Reactions   Codeine Other (See Comments)   Prednisone Nausea And Vomiting    Hallucinations    Physical Exam: There were no vitals taken for this visit.   Physical Exam  Diagnostics:    Assessment and Plan: No diagnosis found.  No orders of the defined types were placed in this encounter.   There are no Patient Instructions on file for this visit.  No follow-ups on file.    Thank you for the opportunity to care for this patient.  Please do not hesitate to contact me with questions.  Thermon Leyland, FNP [facility]

## 2022-12-26 NOTE — Patient Instructions (Signed)
Cough Continue Symbicort 80-2 puffs twice a day with a spacer to prevent cough or wheeze Continue albuterol 2 puffs once every 4 hours as needed for cough or wheeze  Chronic rhinitis Consider saline nasal rinses as needed for nasal symptoms. Use this before any medicated nasal sprays for best result  Reflux Continue dietary lifestyle modifications as listed below Continue omeprazole 20 mg once a day for control of reflux  Call the clinic if this treatment plan is not working well for you.  Follow up in *** or sooner if needed.

## 2022-12-27 ENCOUNTER — Other Ambulatory Visit: Payer: Self-pay

## 2022-12-27 ENCOUNTER — Ambulatory Visit: Payer: Medicare PPO | Admitting: Family Medicine

## 2022-12-27 ENCOUNTER — Encounter: Payer: Self-pay | Admitting: Family Medicine

## 2022-12-27 VITALS — BP 112/62 | HR 60 | Temp 97.7°F | Resp 20 | Ht 61.02 in | Wt 142.5 lb

## 2022-12-27 DIAGNOSIS — J310 Chronic rhinitis: Secondary | ICD-10-CM | POA: Diagnosis not present

## 2022-12-27 DIAGNOSIS — R12 Heartburn: Secondary | ICD-10-CM

## 2022-12-27 DIAGNOSIS — K219 Gastro-esophageal reflux disease without esophagitis: Secondary | ICD-10-CM

## 2022-12-27 DIAGNOSIS — R053 Chronic cough: Secondary | ICD-10-CM

## 2022-12-27 NOTE — Progress Notes (Incomplete)
   [address] [place] [address] Dept: [phone]  FOLLOW UP NOTE  Patient ID: Yvonne Riddle, female    DOB: 07/19/1931  Age: 89 y.o. MRN: [id number] Date of Office Visit: 12/27/2022  Assessment  Chief Complaint: No chief complaint on file.  HPI Yvonne Riddle is a 89 year old female who presents to the clinic for follow-up visit.  She was last seen in this clinic on 10/11/2021 by Dr. Selena Batten as a new patient for evaluation of chronic cough, chronic rhinitis, and reflux.  On 10/11/2021 she had negative environmental allergy skin testing.   Drug Allergies:  Allergies  Allergen Reactions  . Codeine Other (See Comments)  . Prednisone Nausea And Vomiting    Hallucinations    Physical Exam: There were no vitals taken for this visit.   Physical Exam  Diagnostics: FVC 2.21 which is 125% of predicted value, FEV1 1.33 which is 100% of predicted value.  Spirometry indicates mild airway obstruction.  Assessment and Plan: No diagnosis found.  No orders of the defined types were placed in this encounter.   Patient Instructions  Cough Continue Symbicort 80-2 puffs twice a day with a spacer to prevent cough or wheeze Continue albuterol 2 puffs once every 4 hours as needed for cough or wheeze  Chronic rhinitis Consider saline nasal rinses as needed for nasal symptoms. Use this before any medicated nasal sprays for best result  Reflux Continue dietary lifestyle modifications as listed below Continue omeprazole 20 mg once a day for control of reflux  Call the clinic if this treatment plan is not working well for you.  Follow up in *** or sooner if needed.  No follow-ups on file.    Thank you for the opportunity to care for this patient.  Please do not hesitate to contact me with questions.  Thermon Leyland, FNP [facility]

## 2022-12-28 ENCOUNTER — Encounter: Payer: Self-pay | Admitting: Family Medicine

## 2022-12-28 MED ORDER — FLUTICASONE PROPIONATE HFA 110 MCG/ACT IN AERO: 2.0000 | INHALATION_SPRAY | Freq: Two times a day (BID) | RESPIRATORY_TRACT | 5 refills | Status: DC

## 2022-12-28 MED ORDER — FAMOTIDINE 20 MG PO TABS: 20.0000 mg | ORAL_TABLET | Freq: Two times a day (BID) | ORAL | 2 refills | Status: DC

## 2022-12-29 ENCOUNTER — Other Ambulatory Visit: Payer: Self-pay | Admitting: Family Medicine

## 2022-12-29 NOTE — Telephone Encounter (Signed)
Flovent 110 is not covered please advise on change to Arnuity Ellipta 

## 2022-12-29 NOTE — Telephone Encounter (Signed)
Arnuity Ellipta 100-1 puff once a day please in place of Flovent 110. Thank you

## 2023-01-30 ENCOUNTER — Ambulatory Visit
Admission: RE | Admit: 2023-01-30 | Discharge: 2023-01-30 | Disposition: A | Discharge status: Home / Self Care | Payer: Medicare PPO | Source: Ambulatory Visit | Attending: Family Medicine | Admitting: Family Medicine

## 2023-01-30 ENCOUNTER — Ambulatory Visit: Payer: Medicare PPO | Admitting: Family Medicine

## 2023-01-30 ENCOUNTER — Encounter: Payer: Self-pay | Admitting: Family Medicine

## 2023-01-30 VITALS — BP 132/70 | HR 72 | Temp 97.3°F | Resp 16 | Wt 143.5 lb

## 2023-01-30 DIAGNOSIS — K219 Gastro-esophageal reflux disease without esophagitis: Secondary | ICD-10-CM | POA: Diagnosis not present

## 2023-01-30 DIAGNOSIS — J310 Chronic rhinitis: Secondary | ICD-10-CM

## 2023-01-30 DIAGNOSIS — R059 Cough, unspecified: Secondary | ICD-10-CM | POA: Diagnosis not present

## 2023-01-30 DIAGNOSIS — R053 Chronic cough: Secondary | ICD-10-CM | POA: Diagnosis not present

## 2023-01-30 NOTE — Progress Notes (Signed)
[address] [place] [address] Dept: [phone]  FOLLOW UP NOTE  Patient ID: Yvonne Riddle, female    DOB: 1931/07/19  Age: 89 y.o. MRN: [id number] Date of Office Visit: 01/30/2023  Assessment  Chief Complaint: Follow-up (Inhaler is not helping the cough at all. Still spitting up mucous at night and can't sleep due to her coughing.) and Cough (Over a year or more.)  HPI Yvonne Riddle is a 89 year old female who presents to the clinic for follow-up visit.  She was last seen in this clinic on 12/27/2022 by Thermon Leyland, FNP, for evaluation of chronic cough, chronic rhinitis, and reflux.  At that time, she started on Flovent 110 and increased famotidine from 20 mg once a day to 20 mg twice a day. She is accompanied by her daughter who assists with history.  At today's visit, she reports that she continues to experience cough producing clear mucus which occurs during the day and worsens at night. She reports this occurs daily with no fluctuation.  She continues to experience occasional clear rhinorrhea and occasional sneezing.  She denies other symptoms including nasal congestion and postnasal drainage.  She occasionally uses Flonase, however, she reports this generally does not help to decrease the cough.  Her last environmental allergy testing was on 10/11/2021 and was negative to the adult environmental allergy panel.  Chart review indicates lab testing from 03/17/2011 which was negative to environmental allergens.  Chart review indicates that she visited Dr. Aleene Davidson, ENT specialist, on 09/14/2021 at which time she had evaluation with rhinoscopy. She reports that she has taken amlodipine and losartan for the last several years without issue. She denies ever having taken lisinopril.   She denies shortness of breath and wheeze.  She continues Flovent 110-1 puff twice a day and reports a very small amount of reduction in the amount of coughing.  She believes this to mostly be  insignificant.  She has not used her albuterol inhaler since her last visit to this clinic.  Reflux is reported as well-controlled with no heartburn or vomiting.  She did not increase her famotidine to twice a day as recommended at her last visit and continues to take famotidine 20 mg once a day at this time.  Her current medications are listed in the chart.   Procedure:Flexible Laryngoscopy 09/14/2021 After adequate topical anesthetic was applied, 4 mm flexible laryngoscope was passed through the nasal cavity without difficulty. No purulent mucus is seen Flexible laryngoscopy shows patent anterior nasal cavity with minimal crusting, no discharge or infection.  Normal base of tongue and supraglottis Normal vocal cord mobility without vocal cord nodule, mass, polyp or tumor. Hypopharynx normal without mass, pooling of secretions or aspiration.   Drug Allergies:  Allergies  Allergen Reactions   Codeine Other (See Comments)   Prednisone Nausea And Vomiting    Hallucinations    Physical Exam: BP 132/70   Pulse 72   Temp (!) 97.3 F (36.3 C) (Temporal)   Resp 16   Wt 143 lb 8 oz (65.1 kg)   SpO2 98%   BMI 27.09 kg/m    Physical Exam Vitals reviewed.  Constitutional:      Appearance: Normal appearance.  HENT:     Head: Normocephalic and atraumatic.     Right Ear: Tympanic membrane normal.     Left Ear: Tympanic membrane normal.     Nose:     Comments: Bilateral nares slightly erythematous with clear nasal drainage noted.  Pharynx normal.  Ears normal.  Eyes normal.    Mouth/Throat:     Pharynx: Oropharynx is clear.  Eyes:     Conjunctiva/sclera: Conjunctivae normal.  Cardiovascular:     Rate and Rhythm: Normal rate and regular rhythm.     Heart sounds: Normal heart sounds. No murmur heard. Pulmonary:     Effort: Pulmonary effort is normal.     Breath sounds: Normal breath sounds.     Comments: Lungs clear to auscultation Musculoskeletal:        General: Normal range of  motion.     Cervical back: Normal range of motion and neck supple.  Skin:    General: Skin is warm and dry.  Neurological:     Mental Status: She is alert and oriented to person, place, and time.  Psychiatric:        Mood and Affect: Mood normal.        Behavior: Behavior normal.        Thought Content: Thought content normal.        Judgment: Judgment normal.     Diagnostics: FVC 1.24 which is 70% of predicted value, FEV1 1.21 which is 90% of predicted value.  Spirometry indicates normal ventilatory function  Assessment and Plan: 1. Chronic cough   2. Chronic rhinitis   3. Gastroesophageal reflux disease, unspecified whether esophagitis present     No orders of the defined types were placed in this encounter.   Patient Instructions  Chronic rhinitis Increase Flonase nasal spray to 2 sprays in each nostril once a day.  In the right nostril, point the applicator out toward the right ear. In the left nostril, point the applicator out toward the left ear Consider saline nasal rinses as needed for nasal symptoms. Use this before any medicated nasal sprays for best result Consider updating your environmental allergy testing.  Reflux Continue dietary lifestyle modifications as listed below Increase famotidine to 20 mg twice a day  Cough Let's move forward with chest xray. We will call you when the results become available Continue albuterol 2 puffs once every 4 hours as needed for cough or wheeze Continue Flovent 110-2 puffs twice a day with a spacer to prevent cough or wheeze Consider evaluation with ENT for further evaluation of chronic cough Consider an alternative medication to cozaar.   Call the clinic if this treatment plan is not working well for you.  Follow up in 2 months or sooner if needed.   Return in about 2 months (around 04/02/2023), or if symptoms worsen or fail to improve.    Thank you for the opportunity to care for this patient.  Please do not hesitate to  contact me with questions.  Thermon Leyland, FNP [facility]

## 2023-01-30 NOTE — Patient Instructions (Addendum)
Chronic rhinitis Increase Flonase nasal spray to 2 sprays in each nostril once a day.  In the right nostril, point the applicator out toward the right ear. In the left nostril, point the applicator out toward the left ear Consider saline nasal rinses as needed for nasal symptoms. Use this before any medicated nasal sprays for best result Consider updating your environmental allergy testing.  Reflux Continue dietary lifestyle modifications as listed below Increase famotidine to 20 mg twice a day  Cough Let's move forward with chest xray. We will call you when the results become available Continue albuterol 2 puffs once every 4 hours as needed for cough or wheeze Continue Flovent 110-2 puffs twice a day with a spacer to prevent cough or wheeze Consider evaluation with ENT for further evaluation of chronic cough Consider an alternative medication to cozaar.   Call the clinic if this treatment plan is not working well for you.  Follow up in 2 months or sooner if needed.

## 2023-04-03 ENCOUNTER — Other Ambulatory Visit: Payer: Self-pay

## 2023-04-03 ENCOUNTER — Encounter: Payer: Self-pay | Admitting: Family Medicine

## 2023-04-03 ENCOUNTER — Ambulatory Visit: Payer: Medicare PPO | Admitting: Family Medicine

## 2023-04-03 VITALS — BP 134/70 | HR 80 | Temp 97.4°F | Resp 16 | Wt 146.5 lb

## 2023-04-03 DIAGNOSIS — K219 Gastro-esophageal reflux disease without esophagitis: Secondary | ICD-10-CM

## 2023-04-03 DIAGNOSIS — R053 Chronic cough: Secondary | ICD-10-CM

## 2023-04-03 DIAGNOSIS — J310 Chronic rhinitis: Secondary | ICD-10-CM | POA: Diagnosis not present

## 2023-04-03 MED ORDER — OMEPRAZOLE 20 MG PO CPDR: 20.0000 mg | DELAYED_RELEASE_CAPSULE | Freq: Every day | ORAL | 2 refills | Status: AC

## 2023-04-03 MED ORDER — FLUTICASONE PROPIONATE 50 MCG/ACT NA SUSP: 2.0000 | Freq: Every day | NASAL | 5 refills | Status: AC

## 2023-04-03 NOTE — Progress Notes (Signed)
[address] [place] [address] Dept: [phone]  FOLLOW UP NOTE  Patient ID: Yvonne Riddle, female    DOB: Aug 24, 1930  Age: 89 y.o. MRN: [id number] Date of Office Visit: 04/03/2023  Assessment  Chief Complaint: Follow-up (Go over chest xray results. Patient states she never got the results from 01/30/23.)  HPI Yvonne Riddle is a 89 year old female who presents to the clinic for follow-up visit.  She was last seen in this clinic on 01/30/2023 by Thermon Leyland, FNP, for evaluation of chronic rhinitis, reflux, and chronic cough.  She is accompanied by her daughter who assists with history.   At today's visit, she reports that she continues to experience cough which usually occurs at nighttime.  She reports this cough has been occurring over the last 2 years.  She denies new foods, new medications, new personal care products, insect stings, or recent illness.  Her last chest x-ray was on 01/30/2023 and indicated no acute cardiopulmonary process.  She last saw Dr. Aleene Davidson, ENT specialist, on 10/12/2021.  His note indicates rhinoscopy most suggestive of allergy.  Allergic rhinitis is reported as moderately well-controlled with symptoms including occasional clear rhinorrhea, occasional sneezing, and copious postnasal drainage producing thick white to clear mucus.  She reports this coughing is worse at night, however, continues throughout the day as well.  She continues azelastine with no relief of symptoms.  She is not currently using a nasal saline rinse and she reports that she has been out of Flonase for at least a month.  Her last environmental allergy testing was on 10/11/2021 was negative to the environmental panel.  She is not interested in repeating allergy testing at this time.  Reflux is reported as moderately well-controlled with only infrequent heartburn episodes.  She reports that she is not taking any reflux medications at this time, however, the last refill on her Pepcid was  on 12/28/2022 for a 90-day supply.  She reports that she is not currently taking omeprazole.  Asthma is reported as moderately well-controlled with cough as the main symptom.  She denies shortness of breath or wheeze with activity or rest.  She continues Airsupra 2 puffs as needed which is infrequently.  She continues to take losartan.  She reports that she has taken this medication for many years with no adverse effects.  She reports a cough started about 1 to 2 years ago.  She is willing to speak with her primary care provider regarding a possible change in his medication.  Her current medications are listed in the chart.  Drug Allergies:  Allergies  Allergen Reactions   Codeine Other (See Comments)   Prednisone Nausea And Vomiting    Hallucinations    Physical Exam: BP 134/70   Pulse 80   Temp (!) 97.4 F (36.3 C) (Temporal)   Resp 16   Wt 146 lb 8 oz (66.5 kg)   SpO2 97%   BMI 27.66 kg/m    Physical Exam Vitals reviewed.  Constitutional:      Appearance: Normal appearance.  HENT:     Head: Normocephalic and atraumatic.     Right Ear: Tympanic membrane normal.     Left Ear: Tympanic membrane normal.     Nose:     Comments: Bilateral nares slightly erythematous with thin clear nasal drainage noted.  Pharynx is slightly erythematous with no exudate.  Ears normal.  Eyes normal. Eyes:     Conjunctiva/sclera: Conjunctivae normal.  Cardiovascular:     Rate and  Rhythm: Normal rate and regular rhythm.     Heart sounds: Normal heart sounds. No murmur heard. Pulmonary:     Effort: Pulmonary effort is normal.     Breath sounds: Normal breath sounds.     Comments: Lungs clear to auscultation Musculoskeletal:        General: Normal range of motion.     Cervical back: Normal range of motion and neck supple.  Skin:    General: Skin is warm and dry.  Neurological:     Mental Status: She is alert and oriented to person, place, and time.  Psychiatric:        Mood and Affect: Mood  normal.        Behavior: Behavior normal.        Thought Content: Thought content normal.        Judgment: Judgment normal.     Assessment and Plan: 1. Chronic cough   2. Chronic rhinitis   3. Gastroesophageal reflux disease, unspecified whether esophagitis present     Meds ordered this encounter  Medications   omeprazole (PRILOSEC) 20 MG capsule    Sig: Take 1 capsule (20 mg total) by mouth daily.    Dispense:  30 capsule    Refill:  2   fluticasone (FLONASE) 50 MCG/ACT nasal spray    Sig: Place 2 sprays into both nostrils daily.    Dispense:  16 g    Refill:  5    Patient Instructions  Chronic rhinitis Restart Flonase nasal spray to 2 sprays in each nostril once a day.  In the right nostril, point the applicator out toward the right ear. In the left nostril, point the applicator out toward the left ear Begin azelastine 2 sprays in each nostril twice a day for nasal drainage Begin saline nasal rinses as needed for nasal symptoms. Use this before any medicated nasal sprays for best result Consider updating your environmental allergy testing.  Reflux Continue dietary lifestyle modifications as listed below Continue omeprazole 20 mg once a day to control reflux. Take this medication 30 minutes before your first meal for best results Increase famotidine to 20 mg twice a day for reflux control. This may decrease your cough  Cough Continue AirSupra 2 puffs as needed for cough. Do not use more than 12 puffs in one day Talk to your primary care provider about an alternative medication to cozaar.   Call the clinic if this treatment plan is not working well for you.  Follow up in 3 months or sooner if needed.  Return in about 3 months (around 07/03/2023), or if symptoms worsen or fail to improve.    Thank you for the opportunity to care for this patient.  Please do not hesitate to contact me with questions.  Thermon Leyland, FNP [facility]

## 2023-04-03 NOTE — Patient Instructions (Addendum)
Chronic rhinitis Restart Flonase nasal spray to 2 sprays in each nostril once a day.  In the right nostril, point the applicator out toward the right ear. In the left nostril, point the applicator out toward the left ear Begin azelastine 2 sprays in each nostril twice a day for nasal drainage Begin saline nasal rinses as needed for nasal symptoms. Use this before any medicated nasal sprays for best result Consider updating your environmental allergy testing.  Reflux Continue dietary lifestyle modifications as listed below Continue omeprazole 20 mg once a day to control reflux. Take this medication 30 minutes before your first meal for best results Increase famotidine to 20 mg twice a day for reflux control. This may decrease your cough  Cough Continue AirSupra 2 puffs as needed for cough. Do not use more than 12 puffs in one day Talk to your primary care provider about an alternative medication to cozaar.   Call the clinic if this treatment plan is not working well for you.  Follow up in 3 months or sooner if needed.

## 2023-04-06 ENCOUNTER — Other Ambulatory Visit: Payer: Self-pay | Admitting: Family Medicine

## 2023-05-08 DIAGNOSIS — K219 Gastro-esophageal reflux disease without esophagitis: Secondary | ICD-10-CM | POA: Diagnosis not present

## 2023-05-08 DIAGNOSIS — I10 Essential (primary) hypertension: Secondary | ICD-10-CM | POA: Diagnosis not present

## 2023-05-08 DIAGNOSIS — I129 Hypertensive chronic kidney disease with stage 1 through stage 4 chronic kidney disease, or unspecified chronic kidney disease: Secondary | ICD-10-CM | POA: Diagnosis not present

## 2023-05-08 DIAGNOSIS — Z23 Encounter for immunization: Secondary | ICD-10-CM | POA: Diagnosis not present

## 2023-05-08 DIAGNOSIS — N1831 Chronic kidney disease, stage 3a: Secondary | ICD-10-CM | POA: Diagnosis not present

## 2023-05-08 DIAGNOSIS — J45909 Unspecified asthma, uncomplicated: Secondary | ICD-10-CM | POA: Diagnosis not present

## 2023-05-08 DIAGNOSIS — F3341 Major depressive disorder, recurrent, in partial remission: Secondary | ICD-10-CM | POA: Diagnosis not present

## 2023-05-29 DIAGNOSIS — Z79899 Other long term (current) drug therapy: Secondary | ICD-10-CM | POA: Diagnosis not present

## 2023-05-29 DIAGNOSIS — H9192 Unspecified hearing loss, left ear: Secondary | ICD-10-CM | POA: Diagnosis not present

## 2023-05-29 DIAGNOSIS — I10 Essential (primary) hypertension: Secondary | ICD-10-CM | POA: Diagnosis not present

## 2023-05-29 DIAGNOSIS — R011 Cardiac murmur, unspecified: Secondary | ICD-10-CM | POA: Diagnosis not present

## 2023-05-29 DIAGNOSIS — J4520 Mild intermittent asthma, uncomplicated: Secondary | ICD-10-CM | POA: Diagnosis not present

## 2023-05-29 DIAGNOSIS — I739 Peripheral vascular disease, unspecified: Secondary | ICD-10-CM | POA: Diagnosis not present

## 2023-05-29 DIAGNOSIS — K08109 Complete loss of teeth, unspecified cause, unspecified class: Secondary | ICD-10-CM | POA: Diagnosis not present

## 2023-05-29 DIAGNOSIS — M1990 Unspecified osteoarthritis, unspecified site: Secondary | ICD-10-CM | POA: Diagnosis not present

## 2023-05-29 DIAGNOSIS — M810 Age-related osteoporosis without current pathological fracture: Secondary | ICD-10-CM | POA: Diagnosis not present

## 2023-05-29 DIAGNOSIS — F325 Major depressive disorder, single episode, in full remission: Secondary | ICD-10-CM | POA: Diagnosis not present

## 2023-05-29 DIAGNOSIS — K219 Gastro-esophageal reflux disease without esophagitis: Secondary | ICD-10-CM | POA: Diagnosis not present

## 2023-05-29 DIAGNOSIS — Z885 Allergy status to narcotic agent status: Secondary | ICD-10-CM | POA: Diagnosis not present

## 2023-05-29 DIAGNOSIS — Z833 Family history of diabetes mellitus: Secondary | ICD-10-CM | POA: Diagnosis not present

## 2023-05-29 DIAGNOSIS — Z7983 Long term (current) use of bisphosphonates: Secondary | ICD-10-CM | POA: Diagnosis not present

## 2023-05-29 DIAGNOSIS — Z8249 Family history of ischemic heart disease and other diseases of the circulatory system: Secondary | ICD-10-CM | POA: Diagnosis not present

## 2023-06-05 DIAGNOSIS — H0288A Meibomian gland dysfunction right eye, upper and lower eyelids: Secondary | ICD-10-CM | POA: Diagnosis not present

## 2023-06-05 DIAGNOSIS — H04123 Dry eye syndrome of bilateral lacrimal glands: Secondary | ICD-10-CM | POA: Diagnosis not present

## 2023-06-05 DIAGNOSIS — H0288B Meibomian gland dysfunction left eye, upper and lower eyelids: Secondary | ICD-10-CM | POA: Diagnosis not present

## 2023-06-05 DIAGNOSIS — Z961 Presence of intraocular lens: Secondary | ICD-10-CM | POA: Diagnosis not present

## 2023-06-05 DIAGNOSIS — H18513 Endothelial corneal dystrophy, bilateral: Secondary | ICD-10-CM | POA: Diagnosis not present

## 2023-06-05 DIAGNOSIS — H18232 Secondary corneal edema, left eye: Secondary | ICD-10-CM | POA: Diagnosis not present

## 2023-06-08 DIAGNOSIS — H6121 Impacted cerumen, right ear: Secondary | ICD-10-CM | POA: Diagnosis not present

## 2023-07-03 ENCOUNTER — Ambulatory Visit: Payer: Medicare PPO | Admitting: Family Medicine

## 2023-07-06 NOTE — Patient Instructions (Incomplete)
Chronic rhinitis Restart Flonase nasal spray to 2 sprays in each nostril once a day.  In the right nostril, point the applicator out toward the right ear. In the left nostril, point the applicator out toward the left ear Begin azelastine 2 sprays in each nostril twice a day for nasal drainage Begin saline nasal rinses as needed for nasal symptoms. Use this before any medicated nasal sprays for best result Consider updating your environmental allergy testing.  Reflux Continue dietary lifestyle modifications as listed below Continue omeprazole 20 mg once a day to control reflux. Take this medication 30 minutes before your first meal for best results Increase famotidine to 20 mg twice a day for reflux control. This may decrease your cough  Cough Continue AirSupra 2 puffs as needed for cough. Do not use more than 12 puffs in one day Talk to your primary care provider about an alternative medication to cozaar.   Call the clinic if this treatment plan is not working well for you.  Follow up in 3 months or sooner if needed.

## 2023-07-06 NOTE — Progress Notes (Unsigned)
   [address] [place] [address] Dept: [phone]  FOLLOW UP NOTE  Patient ID: Yvonne Riddle, female    DOB: 01/14/1931  Age: 89 y.o. MRN: [id number] Date of Office Visit: 07/07/2023  Assessment  Chief Complaint: No chief complaint on file.  HPI Yvonne Riddle is a 89 year old female who presents to clinic for follow-up visit.  She was last seen in this clinic on 04/03/2023 by Thermon Leyland, FNP, for evaluation of chronic rhinitis, reflux, and cough.  Her last environmental allergy testing was on 10/11/2021 was negative to the environmental panel.  Discussed the use of AI scribe software for clinical note transcription with the patient, who gave verbal consent to proceed.  History of Present Illness             Drug Allergies:  Allergies  Allergen Reactions   Codeine Other (See Comments)   Prednisone Nausea And Vomiting    Hallucinations    Physical Exam: There were no vitals taken for this visit.   Physical Exam  Diagnostics:    Assessment and Plan: No diagnosis found.  No orders of the defined types were placed in this encounter.   There are no Patient Instructions on file for this visit.  No follow-ups on file.    Thank you for the opportunity to care for this patient.  Please do not hesitate to contact me with questions.  Thermon Leyland, FNP [facility]

## 2023-07-07 ENCOUNTER — Ambulatory Visit: Payer: Medicare PPO | Admitting: Family Medicine

## 2023-07-14 ENCOUNTER — Inpatient Hospital Stay: Admission: RE | Admit: 2023-07-14 | Payer: Medicare PPO | Source: Ambulatory Visit

## 2023-07-28 ENCOUNTER — Other Ambulatory Visit: Payer: Self-pay | Admitting: Internal Medicine

## 2023-07-28 DIAGNOSIS — M810 Age-related osteoporosis without current pathological fracture: Secondary | ICD-10-CM

## 2023-09-04 DIAGNOSIS — N1831 Chronic kidney disease, stage 3a: Secondary | ICD-10-CM | POA: Diagnosis not present

## 2023-09-04 DIAGNOSIS — M810 Age-related osteoporosis without current pathological fracture: Secondary | ICD-10-CM | POA: Diagnosis not present

## 2023-09-04 DIAGNOSIS — I129 Hypertensive chronic kidney disease with stage 1 through stage 4 chronic kidney disease, or unspecified chronic kidney disease: Secondary | ICD-10-CM | POA: Diagnosis not present

## 2023-09-04 DIAGNOSIS — R5383 Other fatigue: Secondary | ICD-10-CM | POA: Diagnosis not present

## 2023-09-04 DIAGNOSIS — F3341 Major depressive disorder, recurrent, in partial remission: Secondary | ICD-10-CM | POA: Diagnosis not present

## 2023-09-04 DIAGNOSIS — H6590 Unspecified nonsuppurative otitis media, unspecified ear: Secondary | ICD-10-CM | POA: Diagnosis not present

## 2023-09-04 DIAGNOSIS — I10 Essential (primary) hypertension: Secondary | ICD-10-CM | POA: Diagnosis not present

## 2023-09-04 DIAGNOSIS — Z791 Long term (current) use of non-steroidal anti-inflammatories (NSAID): Secondary | ICD-10-CM | POA: Diagnosis not present

## 2023-09-04 DIAGNOSIS — J45909 Unspecified asthma, uncomplicated: Secondary | ICD-10-CM | POA: Diagnosis not present

## 2023-11-06 DIAGNOSIS — M1990 Unspecified osteoarthritis, unspecified site: Secondary | ICD-10-CM | POA: Diagnosis not present

## 2023-11-06 DIAGNOSIS — Z833 Family history of diabetes mellitus: Secondary | ICD-10-CM | POA: Diagnosis not present

## 2023-11-06 DIAGNOSIS — H9193 Unspecified hearing loss, bilateral: Secondary | ICD-10-CM | POA: Diagnosis not present

## 2023-11-06 DIAGNOSIS — K219 Gastro-esophageal reflux disease without esophagitis: Secondary | ICD-10-CM | POA: Diagnosis not present

## 2023-11-06 DIAGNOSIS — F329 Major depressive disorder, single episode, unspecified: Secondary | ICD-10-CM | POA: Diagnosis not present

## 2023-11-06 DIAGNOSIS — Z79899 Other long term (current) drug therapy: Secondary | ICD-10-CM | POA: Diagnosis not present

## 2023-11-06 DIAGNOSIS — Z8249 Family history of ischemic heart disease and other diseases of the circulatory system: Secondary | ICD-10-CM | POA: Diagnosis not present

## 2023-11-06 DIAGNOSIS — I10 Essential (primary) hypertension: Secondary | ICD-10-CM | POA: Diagnosis not present

## 2023-11-06 DIAGNOSIS — M810 Age-related osteoporosis without current pathological fracture: Secondary | ICD-10-CM | POA: Diagnosis not present

## 2023-12-25 DIAGNOSIS — I10 Essential (primary) hypertension: Secondary | ICD-10-CM | POA: Diagnosis not present

## 2023-12-25 DIAGNOSIS — J4521 Mild intermittent asthma with (acute) exacerbation: Secondary | ICD-10-CM | POA: Diagnosis not present

## 2023-12-25 DIAGNOSIS — Z1331 Encounter for screening for depression: Secondary | ICD-10-CM | POA: Diagnosis not present

## 2023-12-25 DIAGNOSIS — Z0000 Encounter for general adult medical examination without abnormal findings: Secondary | ICD-10-CM | POA: Diagnosis not present

## 2023-12-25 DIAGNOSIS — F3341 Major depressive disorder, recurrent, in partial remission: Secondary | ICD-10-CM | POA: Diagnosis not present

## 2023-12-25 DIAGNOSIS — M160 Bilateral primary osteoarthritis of hip: Secondary | ICD-10-CM | POA: Diagnosis not present

## 2023-12-25 DIAGNOSIS — M47816 Spondylosis without myelopathy or radiculopathy, lumbar region: Secondary | ICD-10-CM | POA: Diagnosis not present

## 2023-12-25 DIAGNOSIS — Z136 Encounter for screening for cardiovascular disorders: Secondary | ICD-10-CM | POA: Diagnosis not present

## 2023-12-25 DIAGNOSIS — M170 Bilateral primary osteoarthritis of knee: Secondary | ICD-10-CM | POA: Diagnosis not present

## 2023-12-25 DIAGNOSIS — Z23 Encounter for immunization: Secondary | ICD-10-CM | POA: Diagnosis not present

## 2023-12-25 DIAGNOSIS — M810 Age-related osteoporosis without current pathological fracture: Secondary | ICD-10-CM | POA: Diagnosis not present

## 2023-12-25 DIAGNOSIS — J302 Other seasonal allergic rhinitis: Secondary | ICD-10-CM | POA: Diagnosis not present

## 2024-02-28 ENCOUNTER — Emergency Department (HOSPITAL_COMMUNITY)
Admission: EM | Admit: 2024-02-28 | Discharge: 2024-02-29 | Disposition: A | Discharge status: Home / Self Care | Attending: Emergency Medicine | Admitting: Emergency Medicine

## 2024-02-28 ENCOUNTER — Other Ambulatory Visit: Payer: Self-pay

## 2024-02-28 ENCOUNTER — Ambulatory Visit: Admission: EM | Admit: 2024-02-28 | Discharge: 2024-02-28 | Disposition: A | Discharge status: Home / Self Care

## 2024-02-28 DIAGNOSIS — K8020 Calculus of gallbladder without cholecystitis without obstruction: Secondary | ICD-10-CM | POA: Diagnosis not present

## 2024-02-28 DIAGNOSIS — Z79899 Other long term (current) drug therapy: Secondary | ICD-10-CM | POA: Insufficient documentation

## 2024-02-28 DIAGNOSIS — Z7982 Long term (current) use of aspirin: Secondary | ICD-10-CM | POA: Diagnosis not present

## 2024-02-28 DIAGNOSIS — D649 Anemia, unspecified: Secondary | ICD-10-CM | POA: Insufficient documentation

## 2024-02-28 DIAGNOSIS — R1011 Right upper quadrant pain: Secondary | ICD-10-CM

## 2024-02-28 DIAGNOSIS — I10 Essential (primary) hypertension: Secondary | ICD-10-CM | POA: Insufficient documentation

## 2024-02-28 DIAGNOSIS — K5730 Diverticulosis of large intestine without perforation or abscess without bleeding: Secondary | ICD-10-CM | POA: Diagnosis not present

## 2024-02-28 DIAGNOSIS — K828 Other specified diseases of gallbladder: Secondary | ICD-10-CM | POA: Diagnosis not present

## 2024-02-28 DIAGNOSIS — N281 Cyst of kidney, acquired: Secondary | ICD-10-CM | POA: Diagnosis not present

## 2024-02-28 DIAGNOSIS — R935 Abnormal findings on diagnostic imaging of other abdominal regions, including retroperitoneum: Secondary | ICD-10-CM | POA: Diagnosis not present

## 2024-02-28 DIAGNOSIS — R1031 Right lower quadrant pain: Secondary | ICD-10-CM | POA: Diagnosis not present

## 2024-02-28 DIAGNOSIS — R109 Unspecified abdominal pain: Secondary | ICD-10-CM | POA: Diagnosis not present

## 2024-02-28 LAB — COMPREHENSIVE METABOLIC PANEL WITH GFR
ALT: 42 U/L (ref 0–44)
AST: 109 U/L — ABNORMAL HIGH (ref 15–41)
Albumin: 3 g/dL — ABNORMAL LOW (ref 3.5–5.0)
Alkaline Phosphatase: 49 U/L (ref 38–126)
Anion gap: 9 (ref 5–15)
BUN: 20 mg/dL (ref 8–23)
CO2: 22 mmol/L (ref 22–32)
Calcium: 8 mg/dL — ABNORMAL LOW (ref 8.9–10.3)
Chloride: 109 mmol/L (ref 98–111)
Creatinine, Ser: 1.01 mg/dL — ABNORMAL HIGH (ref 0.44–1.00)
GFR, Estimated: 53 mL/min — ABNORMAL LOW (ref >60)
Glucose, Bld: 109 mg/dL — ABNORMAL HIGH (ref 70–99)
Potassium: 3.9 mmol/L (ref 3.5–5.1)
Sodium: 140 mmol/L (ref 135–145)
Total Bilirubin: 1.3 mg/dL — ABNORMAL HIGH (ref 0.0–1.2)
Total Protein: 6.1 g/dL — ABNORMAL LOW (ref 6.5–8.1)

## 2024-02-28 LAB — URINALYSIS, ROUTINE W REFLEX MICROSCOPIC
Bilirubin Urine: NEGATIVE
Glucose, UA: NEGATIVE mg/dL
Hgb urine dipstick: NEGATIVE
Ketones, ur: NEGATIVE mg/dL
Nitrite: NEGATIVE
Protein, ur: 30 mg/dL — AB
Specific Gravity, Urine: 1.014 (ref 1.005–1.030)
pH: 6 (ref 5.0–8.0)

## 2024-02-28 LAB — LIPASE, BLOOD: Lipase: 186 U/L — ABNORMAL HIGH (ref 11–51)

## 2024-02-28 LAB — CBC
HCT: 34.7 % — ABNORMAL LOW (ref 36.0–46.0)
Hemoglobin: 11.3 g/dL — ABNORMAL LOW (ref 12.0–15.0)
MCH: 30.5 pg (ref 26.0–34.0)
MCHC: 32.6 g/dL (ref 30.0–36.0)
MCV: 93.5 fL (ref 80.0–100.0)
Platelets: 166 K/uL (ref 150–400)
RBC: 3.71 MIL/uL — ABNORMAL LOW (ref 3.87–5.11)
RDW: 14.1 % (ref 11.5–15.5)
WBC: 9 K/uL (ref 4.0–10.5)
nRBC: 0 % (ref 0.0–0.2)

## 2024-02-28 MED ORDER — ACETAMINOPHEN 325 MG PO TABS
650.0000 mg | ORAL_TABLET | Freq: Once | ORAL | Status: AC
  Administered 2024-02-28: 650 mg via ORAL
  Filled 2024-02-28: qty 2

## 2024-02-28 NOTE — ED Provider Notes (Signed)
 Presents today for significant right upper quadrant pain, bloating, nausea, vomiting and constipation.  She reports she tried to have a bowel movement earlier today and was able to pass just a very small amount of liquid stool however has continued to have bloating and pain that is worsened.  She did take a laxative but this was not helpful.  Recommended further evaluation in the emergency room.  Family member who is with her will transport via POV.   Billy Asberry FALCON, PA-C 02/28/24 1833    Billy Asberry FALCON, PA-C 02/29/24 [phone]

## 2024-02-28 NOTE — ED Triage Notes (Addendum)
 Arrives c/o RLQ and nausea that started today. One episode of vomiting and states she took a laxative today to see if that would help her feel better. Seen at urgent care and sent here for further workup

## 2024-02-28 NOTE — ED Notes (Signed)
 Patient is being discharged from the Urgent Care and sent to the Emergency Department via private vehicle . Per Asberry Senters PA, patient is in need of higher level of care due to abd pain and vomiting. Patient is aware and verbalizes understanding of plan of care.  Vitals:   02/28/24 1823  BP: 136/79  Pulse: 76  Resp: 20  Temp: 98.4 F (36.9 C)  SpO2: 94%

## 2024-02-28 NOTE — ED Triage Notes (Signed)
 Here with Daughter. This started this morning while sitting down around 1-2 pm and just got bloated and can't do much, I tried to make a bowel movement and can't do much, I was more bloated than this but I took an oral laxative (liquid) and still couldn't use it much. I ate this morning like normal. No nausea. But I made myself vomit due to the pain being so bad and bloated, the pain radiates to right side (flank).

## 2024-02-28 NOTE — ED Provider Notes (Signed)
 [place] EMERGENCY DEPARTMENT AT [facility] Provider Note   CSN: [id number] Arrival date & time: 02/28/24  8145     Patient presents with: Abdominal Pain   Yvonne Riddle is a 89 y.o. female past medical history significant for hypertension who presents today for right lower quadrant pain and nausea that began today.  Patient had 1 episode of vomiting today.  Patient took a laxative as well.  Patient was sent by urgent care.  Patient is currently denying any symptoms at this time.    Abdominal Pain Associated symptoms: constipation, nausea and vomiting        Prior to Admission medications   Medication Sig Start Date End Date Taking? Authorizing Provider  albuterol (VENTOLIN HFA) 108 (90 Base) MCG/ACT inhaler Inhale 2 puffs into the lungs every 6 (six) hours as needed. 12/13/21   [provider]  alendronate (FOSAMAX) 70 MG tablet Take 70 mg by mouth once a week. Take with a full glass of water on an empty stomach.    [provider]  amLODipine (NORVASC) 10 MG tablet Take 10 mg by mouth daily.      [provider]  aspirin EC 81 MG tablet Take 81 mg by mouth daily.    [provider]  benzonatate  (TESSALON ) 200 MG capsule Take 200 mg by mouth 3 (three) times daily as needed. 08/29/22   [provider]  Cholecalciferol 50 MCG (2000 UT) CAPS 1 capsule    [provider]  escitalopram (LEXAPRO) 5 MG tablet Take 5 mg by mouth daily. 01/07/20   [provider]  famotidine  (PEPCID ) 20 MG tablet TAKE 1 TABLET BY MOUTH TWICE A DAY 04/06/23   Ambs, Arlean HERO, FNP  fluticasone  (FLONASE ) 50 MCG/ACT nasal spray Place 2 sprays into both nostrils daily. 04/03/23   Cari Arlean HERO, FNP  Fluticasone  Furoate (ARNUITY ELLIPTA) 100 MCG/ACT AEPB Inhale 1 puff into the lungs daily. 02/03/23   Ambs, Arlean HERO, FNP  KLOR-CON M20 20 MEQ tablet Take 20 mEq by mouth daily. 11/13/19   [provider]  losartan (COZAAR) 100 MG tablet Take  100 mg by mouth daily. 09/14/19   [provider]  losartan-hydrochlorothiazide (HYZAAR) 100-12.5 MG tablet Take 1 tablet by mouth daily.    [provider]  omeprazole  (PRILOSEC) 20 MG capsule Take 1 capsule (20 mg total) by mouth daily. 04/03/23   Ambs, Arlean HERO, FNP  UNKNOWN TO PATIENT Oral Laxitive    [provider]    Allergies: Codeine and Prednisone     Review of Systems  Gastrointestinal:  Positive for abdominal pain, constipation, nausea and vomiting.    Updated Vital Signs BP 135/74   Pulse 75   Temp 98.8 F (37.1 C) (Oral)   Resp 18   SpO2 98%   Physical Exam Vitals and nursing note reviewed.  Constitutional:      General: She is not in acute distress.    Appearance: She is well-developed.  HENT:     Head: Normocephalic and atraumatic.  Eyes:     Extraocular Movements: Extraocular movements intact.     Conjunctiva/sclera: Conjunctivae normal.  Cardiovascular:     Rate and Rhythm: Normal rate and regular rhythm.     Heart sounds: Normal heart sounds. No murmur heard. Pulmonary:     Effort: Pulmonary effort is normal. No respiratory distress.     Breath sounds: Normal breath sounds.  Abdominal:     General: Abdomen is flat. There is no  distension.     Palpations: Abdomen is soft.     Tenderness: There is no abdominal tenderness.  Musculoskeletal:        General: No swelling.     Cervical back: Neck supple.  Skin:    General: Skin is warm and dry.     Capillary Refill: Capillary refill takes less than 2 seconds.  Neurological:     General: No focal deficit present.     Mental Status: She is alert and oriented to person, place, and time.  Psychiatric:        Mood and Affect: Mood normal.     (all labs ordered are listed, but only abnormal results are displayed) Labs Reviewed  CBC - Abnormal; Notable for the following components:      Result Value   RBC 3.71 (*)    Hemoglobin 11.3 (*)    HCT 34.7 (*)    All other components  within normal limits  URINALYSIS, ROUTINE W REFLEX MICROSCOPIC - Abnormal; Notable for the following components:   Protein, ur 30 (*)    Leukocytes,Ua TRACE (*)    Bacteria, UA RARE (*)    All other components within normal limits  LIPASE, BLOOD  COMPREHENSIVE METABOLIC PANEL WITH GFR    EKG: None  Radiology: No results found.   Procedures   Medications Ordered in the ED  acetaminophen  (TYLENOL ) tablet 650 mg (650 mg Oral Given 02/28/24 1924)                                    Medical Decision Making Amount and/or Complexity of Data Reviewed Labs: ordered. Radiology: ordered.  Risk OTC drugs.   This patient presents to the ED for concern of abdominal pain differential diagnosis includes pancreatitis, appendicitis, SBO, diverticulitis, choledocholithiasis, acute cholecystitis, gastritis  Additional history obtained   Additional history obtained from Electronic Medical Record External records from outside source obtained and reviewed including Care Everywhere   Lab Tests:  I Ordered, and personally interpreted labs.  The pertinent results include: UA with trace leukocytes, 30 protein, and rare bacteria, mild anemia at 11.3   Imaging Studies ordered:  I ordered imaging studies including CT abdomen pelvis w contrast  I independently visualized and interpreted imaging which showed pending I agree with the radiologist interpretation   Medicines ordered and prescription drug management:  I ordered medication including tylenol     I have reviewed the patients home medicines and have made adjustments as needed  11:30 PM Care of [facility] transferred to [facility] and Dr. Midge at the end of my shift as the patient will require reassessment once labs/imaging have resulted. Patient presentation, ED course, and plan of care discussed with review of all pertinent labs and imaging. Please see his/her note for further details regarding further ED  course and disposition. Plan at time of handoff is CMP and imaging which will determine disposition. This may be altered or completely changed at the discretion of the oncoming team pending results of further workup.       Final diagnoses:  None    ED Discharge Orders     None          Francis Ileana LOISE DEVONNA 02/28/24 2330    Mannie Pac T, DO 02/29/24 [phone]

## 2024-02-29 ENCOUNTER — Emergency Department (HOSPITAL_COMMUNITY)

## 2024-02-29 DIAGNOSIS — R935 Abnormal findings on diagnostic imaging of other abdominal regions, including retroperitoneum: Secondary | ICD-10-CM | POA: Diagnosis not present

## 2024-02-29 DIAGNOSIS — K828 Other specified diseases of gallbladder: Secondary | ICD-10-CM | POA: Diagnosis not present

## 2024-02-29 DIAGNOSIS — N281 Cyst of kidney, acquired: Secondary | ICD-10-CM | POA: Diagnosis not present

## 2024-02-29 DIAGNOSIS — R109 Unspecified abdominal pain: Secondary | ICD-10-CM | POA: Diagnosis not present

## 2024-02-29 DIAGNOSIS — K8020 Calculus of gallbladder without cholecystitis without obstruction: Secondary | ICD-10-CM | POA: Diagnosis not present

## 2024-02-29 DIAGNOSIS — K5730 Diverticulosis of large intestine without perforation or abscess without bleeding: Secondary | ICD-10-CM | POA: Diagnosis not present

## 2024-02-29 MED ORDER — PANTOPRAZOLE SODIUM 20 MG PO TBEC: 20.0000 mg | DELAYED_RELEASE_TABLET | Freq: Every day | ORAL | 1 refills | Status: AC

## 2024-02-29 MED ORDER — IOHEXOL 350 MG/ML SOLN
75.0000 mL | Freq: Once | INTRAVENOUS | Status: AC | PRN
  Administered 2024-02-29: 75 mL via INTRAVENOUS

## 2024-02-29 NOTE — ED Provider Notes (Signed)
  Physical Exam  BP 130/69   Pulse 69   Temp 98.8 F (37.1 C) (Oral)   Resp 18   SpO2 98%   Physical Exam Vitals and nursing note reviewed.  Constitutional:      General: She is not in acute distress.    Appearance: She is not toxic-appearing.  HENT:     Head: Normocephalic and atraumatic.  Pulmonary:     Effort: No respiratory distress.  Abdominal:     Tenderness: There is no abdominal tenderness.  Skin:    Coloration: Skin is not jaundiced or pale.  Neurological:     Mental Status: She is alert and oriented to person, place, and time.  Psychiatric:        Behavior: Behavior normal.     Procedures  Procedures  ED Course / MDM    Medical Decision Making Amount and/or Complexity of Data Reviewed Labs: ordered. Radiology: ordered.  Risk OTC drugs. Prescription drug management.   Patient signed out to me pending imaging.  Please see the previous provider note for further details, full HPI.  In short 89 year old female presents for abdominal pain.  Was seen in urgent care and redirected to ED for imaging.  Patient signed out to me pending imaging.  CT scan of patient abdomen shows mild gallbladder wall thickening with pericholecystic fluid.  Recommends right upper quadrant ultrasound to rule out cholecystitis.  Also noted to have thickening of esophagus.  Patient precaution ultrasound is negative for acutely cystitis.  Does show cholelithiasis.  Patient reevaluated and states currently she has absolutely no pain.  She is requesting discharge.  Labs reviewed and she does have elevated AST to 109 however there is concern for hemolysis.  Patient lipase also elevated 186 however CT abdomen pelvis shows no findings of pancreatitis.  At this time, patient pain-free.  Will discharge patient home, given general surgery follow-up.  Patient given very strict return precautions and she voiced understanding.  Will also send patient with Protonix  for esophagitis.  Stable to  discharge.       Ruthell Lonni FALCON, PA-C 02/29/24 0228    Midge Golas, MD 02/29/24 [phone]

## 2024-02-29 NOTE — ED Notes (Signed)
 Pt transported to CT. Pt and family very upset about been on the hallway. Family and pt oriented we don't have a room available at the moment she is on the hallway bed so we can threat her as soon as possible. She is been transported to CT and as soon we have results back the provider will talk to them about result.

## 2024-02-29 NOTE — ED Notes (Signed)
 Pt transported to [place]

## 2024-02-29 NOTE — Discharge Instructions (Addendum)
 It was a pleasure taking part in your care.  As discussed, your workup here shows that you have stones in your gallbladder however they are not causing any inflammation at this time.  Please follow-up with general surgery, [facility] [facility], for further discussion.  If you develop fevers, nausea or vomiting or increased pain please return to the ED for further care.  Please let your PCP know that you were in the ED.

## 2024-03-12 DIAGNOSIS — J4520 Mild intermittent asthma, uncomplicated: Secondary | ICD-10-CM | POA: Diagnosis not present

## 2024-03-12 DIAGNOSIS — K219 Gastro-esophageal reflux disease without esophagitis: Secondary | ICD-10-CM | POA: Diagnosis not present

## 2024-03-12 DIAGNOSIS — I10 Essential (primary) hypertension: Secondary | ICD-10-CM | POA: Diagnosis not present

## 2024-03-12 DIAGNOSIS — J302 Other seasonal allergic rhinitis: Secondary | ICD-10-CM | POA: Diagnosis not present

## 2024-03-12 DIAGNOSIS — K8020 Calculus of gallbladder without cholecystitis without obstruction: Secondary | ICD-10-CM | POA: Diagnosis not present

## 2024-03-12 DIAGNOSIS — M810 Age-related osteoporosis without current pathological fracture: Secondary | ICD-10-CM | POA: Diagnosis not present

## 2024-03-21 ENCOUNTER — Ambulatory Visit (HOSPITAL_BASED_OUTPATIENT_CLINIC_OR_DEPARTMENT_OTHER)
Admission: RE | Admit: 2024-03-21 | Discharge: 2024-03-21 | Disposition: A | Discharge status: Home / Self Care | Source: Ambulatory Visit | Attending: Internal Medicine | Admitting: Internal Medicine

## 2024-03-21 ENCOUNTER — Other Ambulatory Visit: Payer: Medicare PPO

## 2024-03-21 DIAGNOSIS — M810 Age-related osteoporosis without current pathological fracture: Secondary | ICD-10-CM | POA: Insufficient documentation

## 2024-03-21 DIAGNOSIS — M8589 Other specified disorders of bone density and structure, multiple sites: Secondary | ICD-10-CM | POA: Diagnosis not present

## 2024-03-21 DIAGNOSIS — Z780 Asymptomatic menopausal state: Secondary | ICD-10-CM | POA: Diagnosis not present

## 2024-06-02 ENCOUNTER — Other Ambulatory Visit: Payer: Self-pay

## 2024-06-02 ENCOUNTER — Emergency Department (HOSPITAL_BASED_OUTPATIENT_CLINIC_OR_DEPARTMENT_OTHER)
Admission: EM | Admit: 2024-06-02 | Discharge: 2024-06-02 | Disposition: A | Discharge status: Home / Self Care | Attending: Emergency Medicine | Admitting: Emergency Medicine

## 2024-06-02 ENCOUNTER — Encounter (HOSPITAL_BASED_OUTPATIENT_CLINIC_OR_DEPARTMENT_OTHER): Payer: Self-pay

## 2024-06-02 ENCOUNTER — Emergency Department (HOSPITAL_BASED_OUTPATIENT_CLINIC_OR_DEPARTMENT_OTHER)

## 2024-06-02 DIAGNOSIS — S86111A Strain of other muscle(s) and tendon(s) of posterior muscle group at lower leg level, right leg, initial encounter: Secondary | ICD-10-CM | POA: Diagnosis not present

## 2024-06-02 DIAGNOSIS — I10 Essential (primary) hypertension: Secondary | ICD-10-CM | POA: Diagnosis not present

## 2024-06-02 DIAGNOSIS — M79604 Pain in right leg: Secondary | ICD-10-CM | POA: Diagnosis not present

## 2024-06-02 DIAGNOSIS — Z79899 Other long term (current) drug therapy: Secondary | ICD-10-CM | POA: Diagnosis not present

## 2024-06-02 DIAGNOSIS — S8991XA Unspecified injury of right lower leg, initial encounter: Secondary | ICD-10-CM | POA: Diagnosis present

## 2024-06-02 DIAGNOSIS — Z7982 Long term (current) use of aspirin: Secondary | ICD-10-CM | POA: Insufficient documentation

## 2024-06-02 DIAGNOSIS — X58XXXA Exposure to other specified factors, initial encounter: Secondary | ICD-10-CM | POA: Diagnosis not present

## 2024-06-02 DIAGNOSIS — S86911A Strain of unspecified muscle(s) and tendon(s) at lower leg level, right leg, initial encounter: Secondary | ICD-10-CM | POA: Diagnosis not present

## 2024-06-02 DIAGNOSIS — M79661 Pain in right lower leg: Secondary | ICD-10-CM | POA: Diagnosis not present

## 2024-06-02 DIAGNOSIS — M7989 Other specified soft tissue disorders: Secondary | ICD-10-CM | POA: Diagnosis not present

## 2024-06-02 DIAGNOSIS — M25561 Pain in right knee: Secondary | ICD-10-CM | POA: Diagnosis not present

## 2024-06-02 DIAGNOSIS — S86811A Strain of other muscle(s) and tendon(s) at lower leg level, right leg, initial encounter: Secondary | ICD-10-CM

## 2024-06-02 LAB — BASIC METABOLIC PANEL WITH GFR
Anion gap: 10 (ref 5–15)
BUN: 20 mg/dL (ref 8–23)
CO2: 26 mmol/L (ref 22–32)
Calcium: 9.3 mg/dL (ref 8.9–10.3)
Chloride: 105 mmol/L (ref 98–111)
Creatinine, Ser: 1.19 mg/dL — ABNORMAL HIGH (ref 0.44–1.00)
GFR, Estimated: 43 mL/min — ABNORMAL LOW (ref >60)
Glucose, Bld: 103 mg/dL — ABNORMAL HIGH (ref 70–99)
Potassium: 4.1 mmol/L (ref 3.5–5.1)
Sodium: 141 mmol/L (ref 135–145)

## 2024-06-02 LAB — CBC WITH DIFFERENTIAL/PLATELET
Abs Immature Granulocytes: 0.17 K/uL — ABNORMAL HIGH (ref 0.00–0.07)
Basophils Absolute: 0.1 K/uL (ref 0.0–0.1)
Basophils Relative: 1 %
Eosinophils Absolute: 0.2 K/uL (ref 0.0–0.5)
Eosinophils Relative: 2 %
HCT: 36.1 % (ref 36.0–46.0)
Hemoglobin: 11.9 g/dL — ABNORMAL LOW (ref 12.0–15.0)
Immature Granulocytes: 2 %
Lymphocytes Relative: 20 %
Lymphs Abs: 1.9 K/uL (ref 0.7–4.0)
MCH: 30.4 pg (ref 26.0–34.0)
MCHC: 33 g/dL (ref 30.0–36.0)
MCV: 92.1 fL (ref 80.0–100.0)
Monocytes Absolute: 1 K/uL (ref 0.1–1.0)
Monocytes Relative: 11 %
Neutro Abs: 6.3 K/uL (ref 1.7–7.7)
Neutrophils Relative %: 64 %
Platelets: 200 K/uL (ref 150–400)
RBC: 3.92 MIL/uL (ref 3.87–5.11)
RDW: 14.1 % (ref 11.5–15.5)
WBC: 9.6 K/uL (ref 4.0–10.5)
nRBC: 0 % (ref 0.0–0.2)

## 2024-06-02 MED ORDER — ACETAMINOPHEN 325 MG PO TABS
650.0000 mg | ORAL_TABLET | Freq: Once | ORAL | Status: AC
  Administered 2024-06-02: 650 mg via ORAL
  Filled 2024-06-02: qty 2

## 2024-06-02 MED ORDER — TRAMADOL HCL 50 MG PO TABS: 50.0000 mg | ORAL_TABLET | Freq: Four times a day (QID) | ORAL | 0 refills | Status: AC | PRN

## 2024-06-02 NOTE — ED Provider Notes (Signed)
 [place] EMERGENCY DEPARTMENT AT [facility] Provider Note   CSN: [id number] Arrival date & time: 06/02/24  1241     Patient presents with: Leg Pain   Yvonne Riddle is a 89 y.o. female.   Pt is a 89 yo with pmhx significant for htn.  Pt presents to the ED today with right calf pain.  Pain has been ongoing for 2 days.  It is only painful when she tries to walk.  No known trauma, but she did feel it after walking.  She's worried about a DVT.  No CP.  No SOB.       Prior to Admission medications   Medication Sig Start Date End Date Taking? Authorizing Provider  traMADol  (ULTRAM ) 50 MG tablet Take 1 tablet (50 mg total) by mouth every 6 (six) hours as needed. 06/02/24  Yes Myrtice Lowdermilk, MD  albuterol (VENTOLIN HFA) 108 (90 Base) MCG/ACT inhaler Inhale 2 puffs into the lungs every 6 (six) hours as needed. 12/13/21   [provider]  alendronate (FOSAMAX) 70 MG tablet Take 70 mg by mouth once a week. Take with a full glass of water on an empty stomach.    [provider]  amLODipine (NORVASC) 10 MG tablet Take 10 mg by mouth daily.      [provider]  aspirin EC 81 MG tablet Take 81 mg by mouth daily.    [provider]  benzonatate  (TESSALON ) 200 MG capsule Take 200 mg by mouth 3 (three) times daily as needed. 08/29/22   [provider]  Cholecalciferol 50 MCG (2000 UT) CAPS 1 capsule    [provider]  escitalopram (LEXAPRO) 5 MG tablet Take 5 mg by mouth daily. 01/07/20   [provider]  famotidine  (PEPCID ) 20 MG tablet TAKE 1 TABLET BY MOUTH TWICE A DAY 04/06/23   Ambs, Arlean HERO, FNP  fluticasone  (FLONASE ) 50 MCG/ACT nasal spray Place 2 sprays into both nostrils daily. 04/03/23   Cari Arlean HERO, FNP  Fluticasone  Furoate (ARNUITY ELLIPTA) 100 MCG/ACT AEPB Inhale 1 puff into the lungs daily. 02/03/23   Ambs, Arlean HERO, FNP  KLOR-CON M20 20 MEQ tablet Take 20 mEq by mouth daily. 11/13/19   [provider]   losartan (COZAAR) 100 MG tablet Take 100 mg by mouth daily. 09/14/19   [provider]  losartan-hydrochlorothiazide (HYZAAR) 100-12.5 MG tablet Take 1 tablet by mouth daily.    [provider]  omeprazole  (PRILOSEC) 20 MG capsule Take 1 capsule (20 mg total) by mouth daily. 04/03/23   Cari Arlean HERO, FNP  pantoprazole  (PROTONIX ) 20 MG tablet Take 1 tablet (20 mg total) by mouth daily. 02/29/24   Ruthell Lonni FALCON, PA-C  UNKNOWN TO PATIENT Oral Laxitive    [provider]    Allergies: Codeine and Prednisone     Review of Systems  Musculoskeletal:        Right lower leg pain  All other systems reviewed and are negative.   Updated Vital Signs BP 131/62   Pulse (!) 59   Temp 98.2 F (36.8 C) (Oral)   Resp 15   Ht 5' 3 (1.6 m)   Wt 65.3 kg   SpO2 94%   BMI 25.51 kg/m   Physical Exam Vitals and nursing note reviewed.  Constitutional:      Appearance: Normal appearance.  HENT:     Head: Normocephalic and atraumatic.     Right Ear: External ear normal.     Left Ear:  External ear normal.     Nose: Nose normal.     Mouth/Throat:     Mouth: Mucous membranes are moist.     Pharynx: Oropharynx is clear.  Eyes:     Extraocular Movements: Extraocular movements intact.     Conjunctiva/sclera: Conjunctivae normal.     Pupils: Pupils are equal, round, and reactive to light.  Cardiovascular:     Rate and Rhythm: Normal rate and regular rhythm.     Pulses: Normal pulses.     Heart sounds: Normal heart sounds.  Pulmonary:     Effort: Pulmonary effort is normal.     Breath sounds: Normal breath sounds.  Abdominal:     General: Abdomen is flat. Bowel sounds are normal.     Palpations: Abdomen is soft.  Musculoskeletal:     Cervical back: Normal range of motion and neck supple.       Legs:  Skin:    General: Skin is warm.     Capillary Refill: Capillary refill takes less than 2 seconds.  Neurological:     General: No focal deficit present.     Mental  Status: She is alert and oriented to person, place, and time.  Psychiatric:        Mood and Affect: Mood normal.        Behavior: Behavior normal.     (all labs ordered are listed, but only abnormal results are displayed) Labs Reviewed  CBC WITH DIFFERENTIAL/PLATELET - Abnormal; Notable for the following components:      Result Value   Hemoglobin 11.9 (*)    Abs Immature Granulocytes 0.17 (*)    All other components within normal limits  BASIC METABOLIC PANEL WITH GFR - Abnormal; Notable for the following components:   Glucose, Bld 103 (*)    Creatinine, Ser 1.19 (*)    GFR, Estimated 43 (*)    All other components within normal limits    EKG: None  Radiology: US  Venous Img Lower Unilateral Right Result Date: 06/02/2024 EXAM: ULTRASOUND DUPLEX OF THE RIGHT LOWER EXTREMITY VEINS TECHNIQUE: Duplex ultrasound using B-mode/gray scaled imaging and Doppler spectral analysis and color flow was obtained of the deep venous structures of the right lower extremity. COMPARISON: None available. CLINICAL HISTORY: Leg pain, right calf, post knee pain with mild swelling x 2 days. FINDINGS: The common femoral vein, femoral vein, popliteal vein, and posterior tibial vein of the right lower extremity demonstrate normal compressibility with normal color flow and spectral analysis. IMPRESSION: 1. No evidence of DVT. Electronically signed by: Norman Gatlin MD 06/02/2024 02:49 PM EST RP Workstation: [id number]     Procedures   Medications Ordered in the ED  acetaminophen  (TYLENOL ) tablet 650 mg (650 mg Oral Given 06/02/24 1332)                                    Medical Decision Making Amount and/or Complexity of Data Reviewed Labs: ordered.  Risk OTC drugs. Prescription drug management.   This patient presents to the ED for concern of leg pain, this involves an extensive number of treatment options, and is a complaint that carries with it a high risk of complications and morbidity.  The  differential diagnosis includes dvt, baker's cyst, calf strain   Co morbidities that complicate the patient evaluation  htn   Additional history obtained:  Additional history obtained from epic chart review External records from outside source obtained  and reviewed including family   Lab Tests:  I Ordered, and personally interpreted labs.  The pertinent results include:  cbc nl other than hgb sl low at 11.9 (stable)   Imaging Studies ordered:  I ordered imaging studies including US  leg  I independently visualized and interpreted imaging which showed No evidence of DVT.  I agree with the radiologist interpretation   Medicines ordered and prescription drug management:  I ordered medication including tylenol   for sx  Reevaluation of the patient after these medicines showed that the patient improved I have reviewed the patients home medicines and have made adjustments as needed   Test Considered:  US    Problem List / ED Course:  Posterior knee pain:  possible calf muscle partial tear.  No dvt or baker's cyst.  Pt placed in a brace.  She is to f/u with ortho.  Return if worse.    Reevaluation:  After the interventions noted above, I reevaluated the patient and found that they have :improved   Social Determinants of Health:  Lives at home   Dispostion:  After consideration of the diagnostic results and the patients response to treatment, I feel that the patent would benefit from discharge with outpatient f/u.       Final diagnoses:  Strain of calf muscle, right, initial encounter    ED Discharge Orders          Ordered    traMADol  (ULTRAM ) 50 MG tablet  Every 6 hours PRN        06/02/24 1523               Dean Clarity, MD 06/02/24 1524

## 2024-06-02 NOTE — ED Triage Notes (Signed)
 Pt reports R leg pain x2 days after ambulating. Pt denies any recent injury or trauma.

## 2024-06-02 NOTE — ED Notes (Signed)
 Per EDP, hinged knee brace, applied to patient.

## 2024-06-03 DIAGNOSIS — S86811A Strain of other muscle(s) and tendon(s) at lower leg level, right leg, initial encounter: Secondary | ICD-10-CM | POA: Diagnosis not present

## 2024-06-03 DIAGNOSIS — S86111A Strain of other muscle(s) and tendon(s) of posterior muscle group at lower leg level, right leg, initial encounter: Secondary | ICD-10-CM | POA: Diagnosis not present

## 2024-06-10 DIAGNOSIS — H04123 Dry eye syndrome of bilateral lacrimal glands: Secondary | ICD-10-CM | POA: Diagnosis not present

## 2024-06-10 DIAGNOSIS — H18232 Secondary corneal edema, left eye: Secondary | ICD-10-CM | POA: Diagnosis not present

## 2024-06-10 DIAGNOSIS — Z961 Presence of intraocular lens: Secondary | ICD-10-CM | POA: Diagnosis not present

## 2024-06-10 DIAGNOSIS — H0288B Meibomian gland dysfunction left eye, upper and lower eyelids: Secondary | ICD-10-CM | POA: Diagnosis not present

## 2024-06-10 DIAGNOSIS — H0288A Meibomian gland dysfunction right eye, upper and lower eyelids: Secondary | ICD-10-CM | POA: Diagnosis not present

## 2024-06-10 DIAGNOSIS — H18513 Endothelial corneal dystrophy, bilateral: Secondary | ICD-10-CM | POA: Diagnosis not present
